# Patient Record
Sex: Female | Born: 1965 | Race: White | Hispanic: No | Marital: Married | Smoking: Never smoker
Health system: Southern US, Community
[De-identification: ages and names within clinical notes are randomized; demographics above are authoritative.]

## PROBLEM LIST (undated history)

## (undated) DIAGNOSIS — M069 Rheumatoid arthritis, unspecified: Secondary | ICD-10-CM

## (undated) DIAGNOSIS — S329XXA Fracture of unspecified parts of lumbosacral spine and pelvis, initial encounter for closed fracture: Secondary | ICD-10-CM

## (undated) DIAGNOSIS — F419 Anxiety disorder, unspecified: Secondary | ICD-10-CM

## (undated) DIAGNOSIS — S82409A Unspecified fracture of shaft of unspecified fibula, initial encounter for closed fracture: Secondary | ICD-10-CM

## (undated) DIAGNOSIS — F329 Major depressive disorder, single episode, unspecified: Secondary | ICD-10-CM

## (undated) DIAGNOSIS — J302 Other seasonal allergic rhinitis: Secondary | ICD-10-CM

## (undated) DIAGNOSIS — B029 Zoster without complications: Secondary | ICD-10-CM

## (undated) HISTORY — DX: Rheumatoid arthritis, unspecified: M06.9

## (undated) HISTORY — DX: Major depressive disorder, single episode, unspecified: F32.9

## (undated) HISTORY — DX: Unspecified fracture of shaft of unspecified fibula, initial encounter for closed fracture: S82.409A

## (undated) HISTORY — DX: Fracture of unspecified parts of lumbosacral spine and pelvis, initial encounter for closed fracture: S32.9XXA

## (undated) HISTORY — DX: Zoster without complications: B02.9

## (undated) HISTORY — DX: Anxiety disorder, unspecified: F41.9

---

## 1997-10-17 DIAGNOSIS — F419 Anxiety disorder, unspecified: Secondary | ICD-10-CM

## 1997-10-17 DIAGNOSIS — F32A Depression, unspecified: Secondary | ICD-10-CM

## 1997-10-17 HISTORY — DX: Depression, unspecified: F32.A

## 1997-10-17 HISTORY — DX: Anxiety disorder, unspecified: F41.9

## 2005-10-17 HISTORY — PX: WISDOM TOOTH EXTRACTION: SHX21

## 2006-05-25 ENCOUNTER — Other Ambulatory Visit: Admission: RE | Admit: 2006-05-25 | Discharge: 2006-05-25 | Payer: Self-pay | Admitting: Obstetrics and Gynecology

## 2009-04-16 ENCOUNTER — Other Ambulatory Visit: Admission: RE | Admit: 2009-04-16 | Discharge: 2009-04-16 | Payer: Self-pay | Admitting: Family Medicine

## 2010-05-27 ENCOUNTER — Other Ambulatory Visit: Admission: RE | Admit: 2010-05-27 | Discharge: 2010-05-27 | Payer: Self-pay | Admitting: Family Medicine

## 2011-10-18 DIAGNOSIS — M069 Rheumatoid arthritis, unspecified: Secondary | ICD-10-CM

## 2011-10-18 HISTORY — DX: Rheumatoid arthritis, unspecified: M06.9

## 2013-05-30 ENCOUNTER — Encounter: Payer: Self-pay | Admitting: Nurse Practitioner

## 2013-05-30 ENCOUNTER — Ambulatory Visit (INDEPENDENT_AMBULATORY_CARE_PROVIDER_SITE_OTHER): Payer: BC Managed Care – PPO | Admitting: Nurse Practitioner

## 2013-05-30 VITALS — BP 124/76 | HR 64 | Resp 14 | Ht 71.0 in | Wt 242.2 lb

## 2013-05-30 DIAGNOSIS — Z0000 Encounter for general adult medical examination without abnormal findings: Secondary | ICD-10-CM

## 2013-05-30 DIAGNOSIS — Z01419 Encounter for gynecological examination (general) (routine) without abnormal findings: Secondary | ICD-10-CM

## 2013-05-30 DIAGNOSIS — N920 Excessive and frequent menstruation with regular cycle: Secondary | ICD-10-CM

## 2013-05-30 DIAGNOSIS — E559 Vitamin D deficiency, unspecified: Secondary | ICD-10-CM

## 2013-05-30 LAB — CBC
HCT: 31.7 % — ABNORMAL LOW (ref 36.0–46.0)
Hemoglobin: 10.7 g/dL — ABNORMAL LOW (ref 12.0–15.0)
MCHC: 33.8 g/dL (ref 30.0–36.0)
MCV: 88.5 fL (ref 78.0–100.0)
RDW: 16.3 % — ABNORMAL HIGH (ref 11.5–15.5)

## 2013-05-30 MED ORDER — MEDROXYPROGESTERONE ACETATE 10 MG PO TABS: 10.0000 mg | ORAL_TABLET | Freq: Every day | ORAL | Status: DC

## 2013-05-30 NOTE — Progress Notes (Signed)
Encounter reviewed by Dr. Laurna Shetley Silva.  

## 2013-05-30 NOTE — Patient Instructions (Signed)

## 2013-05-30 NOTE — Progress Notes (Signed)
47 y.o. G0. Partnered Caucasian Fe here for annual exam.  Menses irregular with menorrhagia in March. She skipped June and then started July 24 th with bleeding ever since.  Most days heavy. Super pad and tampon changing every hour.  No cramps. Bloating, or PMS.  Same partner X 7 years age 24 with normal cycles.  Patient's last menstrual period was 05/09/2013.          Sexually active: yes  The current method of family planning is none.    Exercising: no  The patient does not participate in regular exercise at present. Smoker:  no  Health Maintenance: Pap:  01/2012 normal with negative HR HPV at PCP per patient MMG:  05/2012  Colonoscopy:  never [facility]:   [facility] was low normal TDaP:  2007  Labs: PCP maintains all labs.    reports that she has never smoked. She has never used smokeless tobacco. She reports that she does not drink alcohol or use illicit drugs.  Past Medical History  Diagnosis Date  . Depression   . Anxiety     situational   . RA (rheumatoid arthritis) 2013    Past Surgical History  Procedure Laterality Date  . Wisdom tooth extraction  2007    Current Outpatient Prescriptions  Medication Sig Dispense Refill  . busPIRone (BUSPAR) 15 MG tablet Take 15 mg by mouth.      . cholecalciferol (VITAMIN D) 1000 UNITS tablet Take 1,000 Units by mouth daily.      Marland Kitchen etanercept (ENBREL) 50 MG/ML injection Inject 50 mg into the skin once a week.      . folic acid (FOLVITE) 1 MG tablet Take 1 mg by mouth.      . lamoTRIgine (LAMICTAL) 100 MG tablet Take 100 mg by mouth.      . methotrexate (RHEUMATREX) 2.5 MG tablet Take 2.5 mg by mouth.      . Multiple Vitamin (MULTIVITAMIN) tablet Take 1 tablet by mouth daily.      . Turmeric 450 MG CAPS Take by mouth.       No current facility-administered medications for this visit.    Family History  Problem Relation Age of Onset  . Thyroid disease Mother     hypothyroid   . Heart failure Father   . Dementia  Father   . Stroke Father   . Hypertension Father   . Psoriasis Father   . Lupus Maternal Grandmother     ROS:  Pertinent items are noted in HPI.  Otherwise, a comprehensive ROS was negative.  Exam:   BP 124/76  Pulse 64  Resp 14  Ht 5\' 11"  (1.803 m)  Wt 242 lb 3.2 oz (109.861 kg)  BMI 33.79 kg/m2  LMP 05/09/2013 Height: 5\' 11"  (180.3 cm)  Ht Readings from Last 3 Encounters:  05/30/13 5\' 11"  (1.803 m)    General appearance: alert, cooperative and appears stated age Head: Normocephalic, without obvious abnormality, atraumatic Neck: no adenopathy, supple, symmetrical, trachea midline and thyroid normal to inspection and palpation Lungs: clear to auscultation bilaterally Breasts: normal appearance, no masses or tenderness Heart: regular rate and rhythm Abdomen: soft, non-tender; no masses,  no organomegaly Extremities: extremities normal, atraumatic, no cyanosis or edema Skin: Skin color, texture, turgor normal. No rashes or lesions Lymph nodes: Cervical, supraclavicular, and axillary nodes normal. No abnormal inguinal nodes palpated Neurologic: Grossly normal   Pelvic: External genitalia:  no lesions  Urethra:  normal appearing urethra with no masses, tenderness or lesions              Bartholin's and Skene's: normal                 Vagina: normal appearing vagina with normal color and copious amount of thin bloody vaginal bleeding. No lesions              Cervix: anteverted              Pap taken: no Bimanual Exam:  Uterus: upper normal size, contour, position, consistency, mobility, non-tender              Adnexa: no mass, fullness, tenderness               Rectovaginal: Confirms               Anus:  normal sphincter tone, no lesions  A:  Well Woman with normal exam  Recent history of irregular menses  History of menorrhagia and current cycle X 3 weeks.  Female partner  History of RA and new start of Enbrel,  Vit D deficiency  History of anxiety and  depression - well controlled  P:   Pap smear as per guidelines   Mammogram will be scheduled later this month  Will plan Endo biopsy and PUS/ SHGM to evaluate AUB  Have put her on Provera 10 mg daily X 10 to hopefully slow or stop current bleeding - given potential side effects and anticipate a withdrawal bleed. (UPT - Neg)  Will also check labs - TSH and Vit D  Counseled on breast self exam, adequate intake of calcium and vitamin D, diet and exercise return annually or prn  An After Visit Summary was printed and given to the patient.

## 2013-05-31 ENCOUNTER — Telehealth: Payer: Self-pay | Admitting: *Deleted

## 2013-05-31 LAB — VITAMIN D 25 HYDROXY (VIT D DEFICIENCY, FRACTURES): Vit D, 25-Hydroxy: 36 ng/mL (ref 30–89)

## 2013-05-31 NOTE — Telephone Encounter (Signed)
Message copied by Osie Bond on Fri May 31, 2013  3:43 PM ------      Message from: Ria Comment R      Created: Fri May 31, 2013  1:06 PM       As suspected iron count is low with a Hgb of 10.7 - have her to stay on iron tablet.  Vit D is a little low add OTC Vit D a 1000 IU daily. TSH is normal. ------

## 2013-05-31 NOTE — Telephone Encounter (Signed)
LVM to return my call in regards to lab results.

## 2013-06-03 ENCOUNTER — Telehealth: Payer: Self-pay | Admitting: Nurse Practitioner

## 2013-06-03 NOTE — Telephone Encounter (Signed)
Pt is aware of all lab results and is agreeable to vitamin D 1000 IU 1 po qd (otc) and iron (otc).

## 2013-06-03 NOTE — Telephone Encounter (Signed)
Patient is returning a call from [place], please call work number.

## 2013-06-03 NOTE — Telephone Encounter (Signed)
Message copied by Osie Bond on Mon Jun 03, 2013  4:36 PM ------      Message from: Ria Comment R      Created: Fri May 31, 2013  1:06 PM       As suspected iron count is low with a Hgb of 10.7 - have her to stay on iron tablet.  Vit D is a little low add OTC Vit D a 1000 IU daily. TSH is normal. ------

## 2013-06-03 NOTE — Telephone Encounter (Signed)
2nd voice message left for pt to return my call in regards to lab results.

## 2013-06-03 NOTE — Telephone Encounter (Signed)
Message copied by Osie Bond on Mon Jun 03, 2013  4:33 PM ------      Message from: Ria Comment R      Created: Fri May 31, 2013  1:06 PM       As suspected iron count is low with a Hgb of 10.7 - have her to stay on iron tablet.  Vit D is a little low add OTC Vit D a 1000 IU daily. TSH is normal. ------

## 2013-06-03 NOTE — Telephone Encounter (Signed)
LMTCB to discuss ins benefits and schedule shgm/endo bx

## 2013-06-06 NOTE — Telephone Encounter (Signed)
LMTCB to discuss ins benefits and schedule SHGM/Endo Bx.  °

## 2013-06-14 NOTE — Telephone Encounter (Signed)
LMTCB to discuss ins benefits and schedule SHGM/poss Endo Bx

## 2013-06-18 ENCOUNTER — Other Ambulatory Visit: Payer: Self-pay | Admitting: Obstetrics and Gynecology

## 2013-06-18 ENCOUNTER — Telehealth: Payer: Self-pay | Admitting: *Deleted

## 2013-06-18 DIAGNOSIS — N920 Excessive and frequent menstruation with regular cycle: Secondary | ICD-10-CM

## 2013-06-18 NOTE — Telephone Encounter (Signed)
Patient calling to schedule [facility] appt.  On menses now, denies chance for pregnancy due to female/female relationship.  PUS/SHGM/endo bx sched for 06-27-13.  Instructed to take Tylenol before arrival ( patient states she can not take Motrin).

## 2013-06-27 ENCOUNTER — Ambulatory Visit (INDEPENDENT_AMBULATORY_CARE_PROVIDER_SITE_OTHER): Payer: BC Managed Care – PPO | Admitting: Obstetrics and Gynecology

## 2013-06-27 ENCOUNTER — Encounter: Payer: Self-pay | Admitting: Obstetrics and Gynecology

## 2013-06-27 ENCOUNTER — Other Ambulatory Visit: Payer: Self-pay | Admitting: Obstetrics and Gynecology

## 2013-06-27 ENCOUNTER — Other Ambulatory Visit: Payer: Self-pay

## 2013-06-27 ENCOUNTER — Ambulatory Visit (INDEPENDENT_AMBULATORY_CARE_PROVIDER_SITE_OTHER): Payer: BC Managed Care – PPO

## 2013-06-27 VITALS — BP 110/70 | HR 68 | Ht 71.0 in | Wt 245.5 lb

## 2013-06-27 DIAGNOSIS — N920 Excessive and frequent menstruation with regular cycle: Secondary | ICD-10-CM

## 2013-06-27 DIAGNOSIS — N921 Excessive and frequent menstruation with irregular cycle: Secondary | ICD-10-CM

## 2013-06-27 DIAGNOSIS — R9389 Abnormal findings on diagnostic imaging of other specified body structures: Secondary | ICD-10-CM

## 2013-06-27 NOTE — Progress Notes (Signed)
Subjective  Patient is here for pelvic ultrasound, sonohysterogram, and endometrial biopsy. Bleeding improved with Provera therapy.  Objective  See ultrasound below.  Echogenic foci of the endocervix and endometrium.  Normal ovaries.      Sonohysterogram   Consent performed.  Speculum placed.  Sterile prep of cervix with betadine.  Cannula placed.  Speculum removed.  Saline injected. Filling defects noted throughout the endometrial cavity and in the endocervix.  No complications.  Endometrial biopsy  Consent performed.  Speculum placed.  Pipelle passed twice.  Tissue to pathology.  No complications.  Minimal EBL.  Assessment  Menometrorrhagia. Endometrial thickening. Suspect endocervical and endometrial polyps.  Plan  Follow up in 7 - 19 days for discussion of results and recommendations.  After visit summary to patient.

## 2013-06-27 NOTE — Patient Instructions (Signed)
Endometrial Biopsy This is a test in which a tissue sample (a biopsy) is taken from inside the uterus (womb). It is then looked at by a specialist under a microscope to see if the tissue is normal or abnormal. The endometrium is the lining of the uterus. This test helps determine where you are in your menstrual cycle and how hormone levels are affecting the lining of the uterus. Another use for this test is to diagnose endometrial cancer, tuberculosis, polyps, or inflammatory conditions and to evaluate uterine bleeding. PREPARATION FOR TEST No preparation or fasting is necessary. NORMAL FINDINGS No pathologic conditions. Presence of "secretory-type" endometrium 3 to 5 days before to normal menstruation. Ranges for normal findings may vary among different laboratories and hospitals. You should always check with your doctor after having lab work or other tests done to discuss the meaning of your test results and whether your values are considered within normal limits. MEANING OF TEST  Your caregiver will go over the test results with you and discuss the importance and meaning of your results, as well as treatment options and the need for additional tests if necessary. OBTAINING THE TEST RESULTS It is your responsibility to obtain your test results. Ask the lab or department performing the test when and how you will get your results. Document Released: 02/03/2005 Document Revised: 12/26/2011 Document Reviewed: 09/12/2008 Exit[employer] Patient Information 2014 [employer].  

## 2013-06-28 ENCOUNTER — Encounter: Payer: Self-pay | Admitting: Obstetrics and Gynecology

## 2013-06-28 DIAGNOSIS — N921 Excessive and frequent menstruation with irregular cycle: Secondary | ICD-10-CM | POA: Insufficient documentation

## 2013-06-28 DIAGNOSIS — R9389 Abnormal findings on diagnostic imaging of other specified body structures: Secondary | ICD-10-CM | POA: Insufficient documentation

## 2013-06-28 DIAGNOSIS — N920 Excessive and frequent menstruation with regular cycle: Secondary | ICD-10-CM | POA: Insufficient documentation

## 2013-07-05 ENCOUNTER — Ambulatory Visit (INDEPENDENT_AMBULATORY_CARE_PROVIDER_SITE_OTHER): Payer: BC Managed Care – PPO | Admitting: Obstetrics and Gynecology

## 2013-07-05 ENCOUNTER — Telehealth: Payer: Self-pay | Admitting: Obstetrics and Gynecology

## 2013-07-05 ENCOUNTER — Encounter: Payer: Self-pay | Admitting: Obstetrics and Gynecology

## 2013-07-05 VITALS — BP 110/60 | HR 100 | Ht 71.0 in | Wt 246.0 lb

## 2013-07-05 DIAGNOSIS — N83202 Unspecified ovarian cyst, left side: Secondary | ICD-10-CM

## 2013-07-05 DIAGNOSIS — N83209 Unspecified ovarian cyst, unspecified side: Secondary | ICD-10-CM

## 2013-07-05 DIAGNOSIS — Z1239 Encounter for other screening for malignant neoplasm of breast: Secondary | ICD-10-CM

## 2013-07-05 NOTE — Progress Notes (Signed)
Patient ID: Jenny Bailey, female   DOB: 1966/03/27, 47 y.o.   MRN: [id number]  Subjective  Patient here in follow up for pelvic ultrasound and endometrial biopsy.   Ultrasound on 06/27/13 showed ultrasound below. Echogenic foci of the endocervix and endometrium. Simple left ovarian cyst 3.3 cm.  July menses lasted for weeks.  Provera stopped bleeding, then menses returned to 7 days.    Patient interested in ablation.   Declines future childbearing. Has female partner.   Due for mammogram.  Has not scheduled yet.   Objective  No exam.    EMB FINAL MICROSCOPIC DIAGNOSIS: ENDOMETRIUM, BIOPSY: -PROLIFERATIVE ENDOMETRIUM AND PORTIONS OF POLYPOID ENDOMETRIUM WITH PROMINENT BLOOD VESSELS, SUGGESTIVE OF BENIGN ENDOMETRIAL POLYP -NEGATIVE FOR HYPERPLASIA OR CARCINOMA  Assessment  Menometrorrhagia. Endometrial and endocervical polyps. Simple left ovarian cyst.   Plan  Proceed with hysteroscopic polypectomy, dilation and curettage, and Novasure endometrial ablation.  Risks, benefits, and alternatives discussed with the patient who wishes to proceed.   Risks include but are not limited to bleeding, infection, damage to surrounding organs, uterine perforation and subsequent laparoscopy, DVT, PE, reaction to anesthesia, death, incomplete procedure if perforation occurs.   [facility]  Mammogram ordered.

## 2013-07-05 NOTE — Patient Instructions (Addendum)
Endometrial Ablation Endometrial ablation removes the lining of the uterus (endometrium). It is usually a same day, outpatient treatment. Ablation helps avoid major surgery (such as a hysterectomy). A hysterectomy is removal of the cervix and uterus. Endometrial ablation has less risk and complications, has a shorter recovery period and is less expensive. After endometrial ablation, most women will have little or no menstrual bleeding. You may not keep your fertility. Pregnancy is no longer likely after this procedure but if you are pre-menopausal, you still need to use a reliable method of birth control following the procedure because pregnancy can occur. REASONS TO HAVE THE PROCEDURE MAY INCLUDE:  Heavy periods.  Bleeding that is causing anemia.  Anovulatory bleeding, very irregular, bleeding.  Bleeding submucous fibroids (on the lining inside the uterus) if they are smaller than 3 centimeters. REASONS NOT TO HAVE THE PROCEDURE MAY INCLUDE:  You wish to have more children.  You have a pre-cancerous or cancerous problem. The cause of any abnormal bleeding must be diagnosed before having the procedure.  You have pain coming from the uterus.  You have a submucus fibroid larger than 3 centimeters.  You recently had a baby.  You recently had an infection in the uterus.  You have a severe retro-flexed, tipped uterus and cannot insert the instrument to do the ablation.  You had a Cesarean section or deep major surgery on the uterus.  The inner cavity of the uterus is too large for the endometrial ablation instrument. RISKS AND COMPLICATIONS   Perforation of the uterus.  Bleeding.  Infection of the uterus, bladder or vagina.  Injury to surrounding organs.  Cutting the cervix.  An air bubble to the lung (air embolus).  Pregnancy following the procedure.  Failure of the procedure to help the problem requiring hysterectomy.  Decreased ability to diagnose cancer in the lining of  the uterus. BEFORE THE PROCEDURE  The lining of the uterus must be tested to make sure there is no pre-cancerous or cancer cells present.  Medications may be given to make the lining of the uterus thinner.  Ultrasound may be used to evaluate the size and look for abnormalities of the uterus.  Future pregnancy is not desired. PROCEDURE  There are different ways to destroy the lining of the uterus.   Resectoscope - radio frequency-alternating electric current is the most common one used.  Cryotherapy - freezing the lining of the uterus.  Heated Free Liquid - heated salt (saline) solution inserted into the uterus.  Microwave - uses high energy microwaves in the uterus.  Thermal Balloon - a catheter with a balloon tip is inserted into the uterus and filled with heated fluid. Your caregiver will talk with you about the method used in this clinic. They will also instruct you on the pros and cons of the procedure. Endometrial ablation is performed along with a procedure called operative hysteroscopy. A narrow viewing tube is inserted through the birth canal (vagina) and through the cervix into the uterus. A tiny camera attached to the viewing tube (hysteroscope) allows the uterine cavity to be shown on a TV monitor during surgery. Your uterus is filled with a harmless liquid to make the procedure easier. The lining of the uterus is then removed. The lining can also be removed with a resectoscope which allows your surgeon to cut away the lining of the uterus under direct vision. Usually, you will be able to go home within an hour after the procedure. HOME CARE INSTRUCTIONS   Do   not drive for 24 hours.  No tampons, douching or intercourse for 2 weeks or until your caregiver approves.  Rest at home for 24 to 48 hours. You may then resume normal activities unless told differently by your caregiver.  Take your temperature two times a day for 4 days, and record it.  Take any medications your  caregiver has ordered, as directed.  Use some form of contraception if you are pre-menopausal and do not want to get pregnant. Bleeding after the procedure is normal. It varies from light spotting and mildly watery to bloody discharge for 4 to 6 weeks. You may also have mild cramping. Only take over-the-counter or prescription medicines for pain, discomfort, or fever as directed by your caregiver. Do not use aspirin, as this may aggravate bleeding. Frequent urination during the first 24 hours is normal. You will not know how effective your surgery is until at least 3 months after the surgery. SEEK IMMEDIATE MEDICAL CARE IF:   Bleeding is heavier than a normal menstrual cycle.  An oral temperature above 102 F (38.9 C) develops.  You have increasing cramps or pains not relieved with medication or develop belly (abdominal) pain which does not seem to be related to the same area of earlier cramping and pain.  You are light headed, weak or have fainting episodes.  You develop pain in the shoulder strap areas.  You have chest or leg pain.  You have abnormal vaginal discharge.  You have painful urination. Document Released: 08/12/2004 Document Revised: 12/26/2011 Document Reviewed: 11/10/2007 Exit[employer] Patient Information 2014 [employer].  

## 2013-07-05 NOTE — Telephone Encounter (Signed)
LMTCB to discuss ins benefits for surgery. °

## 2013-07-08 NOTE — Telephone Encounter (Signed)
Spoke with patient about her ins benefits for surgery. She wants to think about it and will call back when she is ready to proceed with scheduling.

## 2013-07-08 NOTE — Telephone Encounter (Signed)
Pt returning call

## 2013-08-07 ENCOUNTER — Telehealth: Payer: Self-pay | Admitting: *Deleted

## 2013-08-07 NOTE — Telephone Encounter (Signed)
Call to patient, LMTCB

## 2013-08-07 NOTE — Telephone Encounter (Signed)
Message copied by Alisa Graff on Wed Aug 07, 2013 11:02 AM ------      Message from: Conley Simmonds      Created: Wed Aug 07, 2013 10:25 AM      Regarding: follow up care       Kennon Rounds,            Please reach out to this patient who has multiple endometrial polyps and a possible endocervical polyp diagnosed by ultrasound, sonohysterogram, and endometrial biopsy.            Patient has stated to Carolynn that she wants to wait until 2015 to do a hysteroscopy, polypectomy, dilation and curettage, and ablation.              If she will not be proceeding this year, I will need to see her just after the new year to re-evaluate.  She will also need to be placed in a "surgical hold file."            Thanks!            Conley Simmonds ------

## 2013-08-22 ENCOUNTER — Other Ambulatory Visit: Payer: Self-pay

## 2013-09-27 ENCOUNTER — Telehealth: Payer: Self-pay | Admitting: *Deleted

## 2013-09-27 NOTE — Telephone Encounter (Signed)
See next phone note.

## 2013-09-27 NOTE — Telephone Encounter (Signed)
Call to patient, VM has first and last name confirmation, LM calling to schedule follow up/reevaluation since declined hysteroscopy in Oct.  LMTCB.

## 2013-11-04 NOTE — Telephone Encounter (Signed)
Gabriel Cirri,  Please assist me in calling to schedule.

## 2013-11-11 NOTE — Telephone Encounter (Signed)
Voicemail confirmed patient identity/[email or web address]

## 2014-01-13 ENCOUNTER — Telehealth: Payer: Self-pay | Admitting: Nurse Practitioner

## 2014-01-13 NOTE — Telephone Encounter (Signed)
Patient would an appointment to discuss surgery. Patient says she talked with Patty about this at her last visit.

## 2014-01-13 NOTE — Telephone Encounter (Signed)
Spoke with patient. Patient was last seen on 9/19 by Dr.Silva. States that having an ablation was discussed but she is having some concerns about this due to fluctuations with RA. Would like to come in to discuss a hysterectomy with Dr.Silva instead. Appointment scheduled for 01/15/14 at 1530 with Dr.Silva. Patient agreeable to time and date.  Routing to provider for final review. Patient agreeable to disposition. Will close encounter

## 2014-01-15 ENCOUNTER — Encounter: Payer: Self-pay | Admitting: Obstetrics and Gynecology

## 2014-01-15 ENCOUNTER — Ambulatory Visit (INDEPENDENT_AMBULATORY_CARE_PROVIDER_SITE_OTHER): Payer: BC Managed Care – PPO | Admitting: Obstetrics and Gynecology

## 2014-01-15 VITALS — BP 110/68 | HR 84 | Ht 71.0 in | Wt 245.2 lb

## 2014-01-15 DIAGNOSIS — N920 Excessive and frequent menstruation with regular cycle: Secondary | ICD-10-CM

## 2014-01-15 LAB — CBC
HCT: 34.7 % — ABNORMAL LOW (ref 36.0–46.0)
Hemoglobin: 11.7 g/dL — ABNORMAL LOW (ref 12.0–15.0)
MCH: 30.5 pg (ref 26.0–34.0)
MCHC: 33.7 g/dL (ref 30.0–36.0)
MCV: 90.4 fL (ref 78.0–100.0)
PLATELETS: 249 10*3/uL (ref 150–400)
RBC: 3.84 MIL/uL — AB (ref 3.87–5.11)
RDW: 15.1 % (ref 11.5–15.5)
WBC: 4.4 10*3/uL (ref 4.0–10.5)

## 2014-01-15 NOTE — Progress Notes (Signed)
Patient ID: Jenny Bailey, female   DOB: 04-Feb-1966, 48 y.o.   MRN: [id number] GYNECOLOGY VISIT  PCP:   Jonathon Jordan, MD  Referring provider:   HPI: 48 y.o.   Unknown  Caucasian  female   G0P0 with Patient's last menstrual period was 01/01/2014.   here for  Heavy menses and to discuss hysterectomy. Patient's partner is also present.   Menses every 6 weeks. Last 10 days. Heavy flow and using pad plus tampon every 30 minutes. Lightheaded.  No real pain.   When has menses has increased symptoms of rheumatoid arthritis.  Flares really affect the quality of patient's life and really do coincide.   Pelvic ultrasound 06/27/13 - normal uterus with no masses.  Endometrium thickened and with echogenic focus 10 mm.  Ovaries normal.  No free fluid.  Sonohysterogram 06/27/13 showed multiple filling defects 1 mm and less of the endometrium and of the endocervix 8 mm and less. Endometrial biopsy 06/27/13 showing proliferative endometrium and polypoid endometrium with blood vessesl suggestive of benign endometrial polyp, negative for hyperplasia and cancer.   GYNECOLOGIC HISTORY: Patient's last menstrual period was 01/01/2014. Sexually active:  yes Partner preference: female Contraception:  none  Menopausal hormone therapy: n/a DES exposure:   no Blood transfusions: no    Sexually transmitted diseases:  no  GYN procedures and prior surgeries:  none Last mammogram: 05/2012 [facility]                Last pap and high risk HPV testing: 01/2012 wnl:neg HR HPV(with PCP)   History of abnormal pap smear: no    OB History   Grav Para Term Preterm Abortions TAB SAB Ect Mult Living   0                LIFESTYLE: Exercise:  no            Tobacco:  no Alcohol:  no Drug use:  no  Patient Active Problem List   Diagnosis Date Noted  . Left ovarian cyst 07/05/2013  . Menorrhagia 06/28/2013  . Metrorrhagia 06/28/2013  . Endometrial thickening on ultra sound 06/28/2013    Past Medical  History  Diagnosis Date  . Depression   . Anxiety     situational   . RA (rheumatoid arthritis) 2013    Past Surgical History  Procedure Laterality Date  . Wisdom tooth extraction  2007    Current Outpatient Prescriptions  Medication Sig Dispense Refill  . busPIRone (BUSPAR) 15 MG tablet Take 15 mg by mouth.      . cholecalciferol (VITAMIN D) 1000 UNITS tablet Take 1,000 Units by mouth daily.      Marland Kitchen etanercept (ENBREL) 50 MG/ML injection Inject 50 mg into the skin once a week.      . fluticasone (FLONASE) 50 MCG/ACT nasal spray Place 1 spray into both nostrils daily.      . folic acid (FOLVITE) 1 MG tablet Take 1 mg by mouth.      . hydroxychloroquine (PLAQUENIL) 200 MG tablet Take 200 mg by mouth daily.      Marland Kitchen lamoTRIgine (LAMICTAL) 100 MG tablet Take 100 mg by mouth.      Marland Kitchen LORazepam (ATIVAN) 0.5 MG tablet Take 0.5 mg by mouth as needed.      . methotrexate (RHEUMATREX) 2.5 MG tablet Take 2.5 mg by mouth.      . Multiple Vitamin (MULTIVITAMIN) tablet Take 1 tablet by mouth daily.      . sertraline (ZOLOFT)  50 MG tablet Take 50 mg by mouth daily. 3 tabs daily      . Turmeric 450 MG CAPS Take by mouth.       No current facility-administered medications for this visit.     ALLERGIES: Review of patient's allergies indicates no known allergies.  Family History  Problem Relation Age of Onset  . Thyroid disease Mother     hypothyroid   . Heart failure Father   . Dementia Father   . Stroke Father   . Hypertension Father   . Psoriasis Father   . Lupus Maternal Grandmother   . Breast cancer Maternal Grandmother 60    History   Social History  . Marital Status: Unknown    Spouse Name: N/A    Number of Children: N/A  . Years of Education: N/A   Occupational History  . Not on file.   Social History Main Topics  . Smoking status: Never Smoker   . Smokeless tobacco: Never Used  . Alcohol Use: No  . Drug Use: No  . Sexual Activity: Yes    Partners: Female    Publishing copy Protection: None   Other Topics Concern  . Not on file   Social History Narrative  . No narrative on file    ROS:  Pertinent items are noted in HPI.  PHYSICAL EXAMINATION:    BP 110/68  Pulse 84  Ht 5\' 11"  (1.803 m)  Wt 245 lb 3.2 oz (111.222 kg)  BMI 34.21 kg/m2  LMP 01/01/2014   Wt Readings from Last 3 Encounters:  01/15/14 245 lb 3.2 oz (111.222 kg)  07/05/13 246 lb (111.585 kg)  06/27/13 245 lb 8 oz (111.358 kg)     Ht Readings from Last 3 Encounters:  01/15/14 5\' 11"  (1.803 m)  07/05/13 5\' 11"  (1.803 m)  06/27/13 5\' 11"  (1.803 m)    General appearance: alert, cooperative and appears stated age Abdomen: soft, non-tender; no masses,  no organomegaly   Pelvic: External genitalia:  no lesions              Urethra:  normal appearing urethra with no masses, tenderness or lesions              Bartholins and Skenes: normal                 Vagina: normal appearing vagina with normal color and discharge, no lesions              Cervix: normal appearance                 Bimanual Exam:  Uterus:  uterus is normal size, shape, consistency and nontender - exam limited by involuntary guarding.                                      Adnexa: normal adnexa in size, nontender and no masses                                         ASSESSMENT  Menorrhagia. Endometrial polyps suspected.  PLAN  Discussion of hysterectomy I have had a comprehensive discussion with the patient regarding.  I have provided material regarding robotic hysterectomy    We discussed benefits and risks of surgery which include but are not limited to bleeding,  infection, damage to surrounding organs, ureteral damage, vaginal cuff dehiscence,  DVT, PE, death, and reaction to anesthesia.    I have discussed surgical expectations regarding the procedures and success rates, outcomes, and recovery.     An After Visit Summary was printed and given to the patient.   25 minutes face to face time of which  over 50% in patient counseling.

## 2014-01-15 NOTE — Patient Instructions (Signed)
Hysterectomy Information  A hysterectomy is a surgery in which your uterus is removed. This surgery may be done to treat various medical problems. After the surgery, you will no longer have menstrual periods. The surgery will also make you unable to become pregnant (sterile). The fallopian tubes and ovaries can be removed (bilateral salpingo-oophorectomy) during this surgery as well.  REASONS FOR A HYSTERECTOMY  Persistent, abnormal bleeding.  Lasting (chronic) pelvic pain or infection.  The lining of the uterus (endometrium) starts growing outside the uterus (endometriosis).  The endometrium starts growing in the muscle of the uterus (adenomyosis).  The uterus falls down into the vagina (pelvic organ prolapse).  Noncancerous growths in the uterus (uterine fibroids) that cause symptoms.  Precancerous cells.  Cervical cancer or uterine cancer. TYPES OF HYSTERECTOMIES  Supracervical hysterectomy In this type, the top part of the uterus is removed, but not the cervix.  Total hysterectomy The uterus and cervix are removed.  Radical hysterectomy The uterus, the cervix, and the fibrous tissue that holds the uterus in place in the pelvis (parametrium) are removed. WAYS A HYSTERECTOMY CAN BE PERFORMED  Abdominal hysterectomy A large surgical cut (incision) is made in the abdomen. The uterus is removed through this incision.  Vaginal hysterectomy An incision is made in the vagina. The uterus is removed through this incision. There are no abdominal incisions.  Conventional laparoscopic hysterectomy Three or four small incisions are made in the abdomen. A thin, lighted tube with a camera (laparoscope) is inserted into one of the incisions. Other tools are put through the other incisions. The uterus is cut into small pieces. The small pieces are removed through the incisions, or they are removed through the vagina.  Laparoscopically assisted vaginal hysterectomy (LAVH) Three or four small  incisions are made in the abdomen. Part of the surgery is performed laparoscopically and part vaginally. The uterus is removed through the vagina.  Robot-assisted laparoscopic hysterectomy A laparoscope and other tools are inserted into 3 or 4 small incisions in the abdomen. A computer-controlled device is used to give the surgeon a 3D image and to help control the surgical instruments. This allows for more precise movements of surgical instruments. The uterus is cut into small pieces and removed through the incisions or removed through the vagina. RISKS AND COMPLICATIONS  Possible complications associated with this procedure include:  Bleeding and risk of blood transfusion. Tell your health care provider if you do not want to receive any blood products.  Blood clots in the legs or lung.  Infection.  Injury to surrounding organs.  Problems or side effects related to anesthesia.  Conversion to an abdominal hysterectomy from one of the other techniques. WHAT TO EXPECT AFTER A HYSTERECTOMY  You will be given pain medicine.  You will need to have someone with you for the first 3 5 days after you go home.  You will need to follow up with your surgeon in 2 4 weeks after surgery to evaluate your progress.  You may have early menopause symptoms such as hot flashes, night sweats, and insomnia.  If you had a hysterectomy for a problem that was not cancer or not a condition that could lead to cancer, then you no longer need Pap tests. However, even if you no longer need a Pap test, a regular exam is a good idea to make sure no other problems are starting. Document Released: 03/29/2001 Document Revised: 07/24/2013 Document Reviewed: 06/10/2013 [facility] Patient Information 2014 [place].

## 2014-01-16 LAB — TSH: TSH: 1.175 u[IU]/mL (ref 0.350–4.500)

## 2014-01-21 ENCOUNTER — Telehealth: Payer: Self-pay | Admitting: Obstetrics and Gynecology

## 2014-01-21 NOTE — Telephone Encounter (Signed)
Left message for patient to call back. Need to go over surgery benefits. °

## 2014-01-21 NOTE — Telephone Encounter (Signed)
Spoke with patient. Advised that per insurance quote received, she will be responsible for $1064.99 for the surgeons portion of her surgery. Advised that the nurse would contact her to schedule. Patient agreeable. Patient prefers to schedule early May or after June 15.

## 2014-01-21 NOTE — Telephone Encounter (Signed)
Pt returned call

## 2014-01-28 NOTE — Telephone Encounter (Signed)
Surgery scheduled for 02-25-14 based on dates given to Sabrina.  Call to patient. Notified of surgery date and time, patient agreeable.  Surgery instruction sheet reviewed and will mail to patient.  Pre/post op appointments scheduled.  Routing to provider for final review. Patient agreeable to disposition. Will close encounter

## 2014-01-28 NOTE — Telephone Encounter (Signed)
Patient is calling to check the status of surgery. °

## 2014-02-10 ENCOUNTER — Ambulatory Visit (INDEPENDENT_AMBULATORY_CARE_PROVIDER_SITE_OTHER): Payer: BC Managed Care – PPO | Admitting: Obstetrics and Gynecology

## 2014-02-10 ENCOUNTER — Encounter: Payer: Self-pay | Admitting: Obstetrics and Gynecology

## 2014-02-10 VITALS — BP 120/70 | HR 82 | Ht 71.25 in | Wt 244.2 lb

## 2014-02-10 DIAGNOSIS — N920 Excessive and frequent menstruation with regular cycle: Secondary | ICD-10-CM

## 2014-02-10 NOTE — Patient Instructions (Signed)
I will see you the day of surgery!  Do not take the Enbrel the week before your surgery and do not take the methotrexate the Sunday before your surgery.

## 2014-02-10 NOTE — Progress Notes (Signed)
Patient ID: Jenny Bailey, female DOB: 05/10/1966, 47 y.o. MRN: [id number]  GYNECOLOGY VISIT  PCP: Sharon Wolters, MD  Referring provider:  HPI:  47 y.o. Partnered Caucasian female  G0P0 with Patient's last menstrual period 02-10-14.  here to discuss surgery.  Menses every 6 weeks.  Last 10 days.  Heavy flow and using pad plus tampon every 30 minutes.  Lightheaded.  No real pain.  When has menses has increased symptoms of rheumatoid arthritis. Takes Enbrel once a week on Thursdays and Metrotrexate once a week on Sundays.  Wants hysterectomy and ovarian removal.  Declines future childbearing.  Flares really affect the quality of patient's life and really do coincide.  Pelvic ultrasound 06/27/13 - normal uterus with no masses. Endometrium thickened and with echogenic focus 10 mm. Ovaries normal. No free fluid.  Sonohysterogram 06/27/13 showed multiple filling defects 1 mm and less of the endometrium and of the endocervix 8 mm and less.  Endometrial biopsy 06/27/13 showing proliferative endometrium and polypoid endometrium with blood vessesl suggestive of benign endometrial polyp, negative for hyperplasia and cancer.  GYNECOLOGIC HISTORY:  Patient's last menstrual period was 01/01/2014.  Sexually active: yes  Partner preference: female  Contraception: none  Menopausal hormone therapy: n/a  DES exposure: no  Blood transfusions: no  Sexually transmitted diseases: no  GYN procedures and prior surgeries: none  Last mammogram: 05/2012 wnl:[facility]. Did last week at [facility]. Told it was normal by email.  Last pap and high risk HPV testing: 01/2012 wnl:neg HR HPV(with PCP)  History of abnormal pap smear: no  OB History    Grav  Para  Term  Preterm  Abortions  TAB  SAB  Ect  Mult  Living    0              LIFESTYLE:  Exercise: no  Tobacco: no  Alcohol: no  Drug use: no  Patient Active Problem List    Diagnosis  Date Noted   .  Left ovarian cyst  07/05/2013   .  Menorrhagia  06/28/2013    .  Metrorrhagia  06/28/2013   .  Endometrial thickening on ultra sound  06/28/2013    Past Medical History   Diagnosis  Date   .  Depression    .  Anxiety      situational   .  RA (rheumatoid arthritis)  2013    Past Surgical History   Procedure  Laterality  Date   .  Wisdom tooth extraction   2007    Current Outpatient Prescriptions   Medication  Sig  Dispense  Refill   .  busPIRone (BUSPAR) 15 MG tablet  Take 15 mg by mouth.     .  cholecalciferol (VITAMIN D) 1000 UNITS tablet  Take 1,000 Units by mouth daily.     .  etanercept (ENBREL) 50 MG/ML injection  Inject 50 mg into the skin once a week.     .  fluticasone (FLONASE) 50 MCG/ACT nasal spray  Place 1 spray into both nostrils daily.     .  folic acid (FOLVITE) 1 MG tablet  Take 1 mg by mouth.     .  hydroxychloroquine (PLAQUENIL) 200 MG tablet  Take 200 mg by mouth daily.     .  lamoTRIgine (LAMICTAL) 100 MG tablet  Take 100 mg by mouth.     .  LORazepam (ATIVAN) 0.5 MG tablet  Take 0.5 mg by mouth as needed.     .  methotrexate (  RHEUMATREX) 2.5 MG tablet  Take 2.5 mg by mouth.     .  Multiple Vitamin (MULTIVITAMIN) tablet  Take 1 tablet by mouth daily.     .  sertraline (ZOLOFT) 50 MG tablet  Take 50 mg by mouth daily. 3 tabs daily     .  Turmeric 450 MG CAPS  Take by mouth.      No current facility-administered medications for this visit.   ALLERGIES: Review of patient's allergies indicates no known allergies.  Family History   Problem  Relation  Age of Onset   .  Thyroid disease  Mother      hypothyroid   .  Heart failure  Father    .  Dementia  Father    .  Stroke  Father    .  Hypertension  Father    .  Psoriasis  Father    .  Lupus  Maternal Grandmother    .  Breast cancer  Maternal Grandmother  60    History    Social History   .  Marital Status:  Unknown     Spouse Name:  N/A     Number of Children:  N/A   .  Years of Education:  N/A    Occupational History   .  Not on file.    Social History  Main Topics   .  Smoking status:  Never Smoker   .  Smokeless tobacco:  Never Used   .  Alcohol Use:  No   .  Drug Use:  No   .  Sexual Activity:  Yes     Partners:  Female     Birth Control/ Protection:  None    Other Topics  Concern   .  Not on file    Social History Narrative   .  No narrative on file   ROS: Pertinent items are noted in HPI.  PHYSICAL EXAMINATION:  LMP 01/01/2014  Wt Readings from Last 3 Encounters:   01/15/14  245 lb 3.2 oz (111.222 kg)   07/05/13  246 lb (111.585 kg)   06/27/13  245 lb 8 oz (111.358 kg)    Ht Readings from Last 3 Encounters:   01/15/14  5' 11" (1.803 m)   07/05/13  5' 11" (1.803 m)   06/27/13  5' 11" (1.803 m)   General appearance: alert, cooperative and appears stated age  Head: Normocephalic, without obvious abnormality, atraumatic  Neck: no adenopathy, supple, symmetrical, trachea midline and thyroid not enlarged, symmetric, no tenderness/mass/nodules  Lungs: clear to auscultation bilaterally  Breasts: Inspection negative, No nipple retraction or dimpling, No nipple discharge or bleeding, No axillary or supraclavicular adenopathy, Normal to palpation without dominant masses  Heart: regular rate and rhythm  Abdomen: soft, non-tender; no masses, no organomegaly  Extremities: extremities normal, atraumatic, no cyanosis or edema  Skin: Skin color, texture, turgor normal. No rashes or lesions  Lymph nodes: Cervical, supraclavicular, and axillary nodes normal.  No abnormal inguinal nodes palpated  Neurologic: Grossly normal  Pelvic: External genitalia: no lesions  Urethra: normal appearing urethra with no masses, tenderness or lesions  Bartholins and Skenes: normal  Vagina: normal appearing vagina with normal color and discharge, no lesions  Cervix: normal appearance  Bimanual Exam: Uterus: uterus is normal size, shape, consistency and nontender  Adnexa: normal adnexa in size, nontender and no masses  ASSESSMENT  Menorrhagia.  History  of rheumatoid arthritis. On Enbrel and Methotrexate.  PLAN  Robotic total laparoscopic   hysterectomy with bilateral salpingo-oophorectomy.  Risks, benefits, and alternatives discussed with the patient who wishes to proceed.  Patient and I did a medical review of Rheumatoid arthritis and menstrual effect on symptoms and she understands that the removal of the ovaries is not a guarantee of no flares of her arthritic symptoms. Some patients do better in their secretory phase of the cycle according to Up To Date. She understands that without some form of hormonal replacement, she would be at increased risk of osteoporosis and heart disease. We may start with estrogen replacement and then transition to adding progesterone if needed. She understands that this is not the usual way to prescribe to a hysterectomized and oophorectomized patient.  Patient will not take Enbrel or methotrexate in the 7 days leading up to surgery.  An After Visit Summary was printed and given to the patient.  30 minutes face to face time of which over 50% was spent in counseling.     

## 2014-02-12 ENCOUNTER — Encounter (HOSPITAL_COMMUNITY): Payer: Self-pay

## 2014-02-19 NOTE — Patient Instructions (Addendum)
   Your procedure is scheduled on:  Tuesday, May 12  Enter through the Main Entrance of [facility] at:  6 AM Pick up the phone at the desk and dial [phone] and inform us of your arrival.  Please call this number if you have any problems the morning of surgery: [phone]  Remember: Do not eat or drink after midnight: Monday Take these medicines the morning of surgery with a SIP OF WATER: buspar, plaquenil, lamictal, zoloft, ativan if needed  Do not wear jewelry, make-up, or FINGER nail polish No metal in your hair or on your body. Do not wear lotions, powders, perfumes.  You may wear deodorant.  Do not bring valuables to the hospital. Contacts, dentures or bridgework may not be worn into surgery.  Leave suitcase in the car. After Surgery it may be brought to your room. For patients being admitted to the hospital, checkout time is 11:00am the day of discharge.    Patients discharged on the day of surgery will not be allowed to drive home.  Home with partner Jenny Bailey cell [phone].

## 2014-02-20 ENCOUNTER — Encounter (HOSPITAL_COMMUNITY): Payer: Self-pay

## 2014-02-20 ENCOUNTER — Encounter (HOSPITAL_COMMUNITY)
Admission: RE | Admit: 2014-02-20 | Discharge: 2014-02-20 | Disposition: A | Discharge status: Home / Self Care | Payer: BC Managed Care – PPO | Source: Ambulatory Visit | Attending: Obstetrics and Gynecology | Admitting: Obstetrics and Gynecology

## 2014-02-20 DIAGNOSIS — Z01811 Encounter for preprocedural respiratory examination: Secondary | ICD-10-CM | POA: Insufficient documentation

## 2014-02-20 DIAGNOSIS — Z01812 Encounter for preprocedural laboratory examination: Secondary | ICD-10-CM | POA: Insufficient documentation

## 2014-02-20 HISTORY — DX: Other seasonal allergic rhinitis: J30.2

## 2014-02-20 LAB — CBC
HCT: 35.3 % — ABNORMAL LOW (ref 36.0–46.0)
Hemoglobin: 11.4 g/dL — ABNORMAL LOW (ref 12.0–15.0)
MCH: 30.1 pg (ref 26.0–34.0)
MCHC: 32.3 g/dL (ref 30.0–36.0)
MCV: 93.1 fL (ref 78.0–100.0)
PLATELETS: 200 10*3/uL (ref 150–400)
RBC: 3.79 MIL/uL — ABNORMAL LOW (ref 3.87–5.11)
RDW: 14.1 % (ref 11.5–15.5)
WBC: 3.8 10*3/uL — ABNORMAL LOW (ref 4.0–10.5)

## 2014-02-25 ENCOUNTER — Encounter (HOSPITAL_COMMUNITY): Payer: Self-pay | Admitting: *Deleted

## 2014-02-25 ENCOUNTER — Encounter (HOSPITAL_COMMUNITY)
Admission: RE | Disposition: A | Discharge status: Home / Self Care | Payer: Self-pay | Source: Ambulatory Visit | Attending: Obstetrics and Gynecology

## 2014-02-25 ENCOUNTER — Encounter (HOSPITAL_COMMUNITY): Payer: BC Managed Care – PPO | Admitting: Certified Registered Nurse Anesthetist

## 2014-02-25 ENCOUNTER — Ambulatory Visit (HOSPITAL_COMMUNITY): Payer: BC Managed Care – PPO | Admitting: Certified Registered Nurse Anesthetist

## 2014-02-25 ENCOUNTER — Observation Stay (HOSPITAL_COMMUNITY)
Admission: RE | Admit: 2014-02-25 | Discharge: 2014-02-26 | Disposition: A | Discharge status: Home / Self Care | Payer: BC Managed Care – PPO | Source: Ambulatory Visit | Attending: Obstetrics and Gynecology | Admitting: Obstetrics and Gynecology

## 2014-02-25 DIAGNOSIS — D649 Anemia, unspecified: Secondary | ICD-10-CM | POA: Insufficient documentation

## 2014-02-25 DIAGNOSIS — N72 Inflammatory disease of cervix uteri: Secondary | ICD-10-CM | POA: Insufficient documentation

## 2014-02-25 DIAGNOSIS — N920 Excessive and frequent menstruation with regular cycle: Secondary | ICD-10-CM

## 2014-02-25 DIAGNOSIS — N921 Excessive and frequent menstruation with irregular cycle: Secondary | ICD-10-CM

## 2014-02-25 DIAGNOSIS — R112 Nausea with vomiting, unspecified: Secondary | ICD-10-CM | POA: Insufficient documentation

## 2014-02-25 DIAGNOSIS — D259 Leiomyoma of uterus, unspecified: Secondary | ICD-10-CM | POA: Insufficient documentation

## 2014-02-25 DIAGNOSIS — M069 Rheumatoid arthritis, unspecified: Secondary | ICD-10-CM | POA: Insufficient documentation

## 2014-02-25 DIAGNOSIS — N8300 Follicular cyst of ovary, unspecified side: Secondary | ICD-10-CM | POA: Insufficient documentation

## 2014-02-25 DIAGNOSIS — R9389 Abnormal findings on diagnostic imaging of other specified body structures: Secondary | ICD-10-CM | POA: Insufficient documentation

## 2014-02-25 DIAGNOSIS — F329 Major depressive disorder, single episode, unspecified: Secondary | ICD-10-CM | POA: Insufficient documentation

## 2014-02-25 DIAGNOSIS — F3289 Other specified depressive episodes: Secondary | ICD-10-CM | POA: Insufficient documentation

## 2014-02-25 DIAGNOSIS — Z90710 Acquired absence of both cervix and uterus: Secondary | ICD-10-CM | POA: Diagnosis present

## 2014-02-25 DIAGNOSIS — N831 Corpus luteum cyst of ovary, unspecified side: Secondary | ICD-10-CM | POA: Insufficient documentation

## 2014-02-25 DIAGNOSIS — N83209 Unspecified ovarian cyst, unspecified side: Secondary | ICD-10-CM | POA: Insufficient documentation

## 2014-02-25 DIAGNOSIS — N808 Other endometriosis: Secondary | ICD-10-CM

## 2014-02-25 HISTORY — PX: ROBOTIC ASSISTED TOTAL HYSTERECTOMY: SHX6085

## 2014-02-25 HISTORY — PX: CYSTOSCOPY: SHX5120

## 2014-02-25 LAB — COMPREHENSIVE METABOLIC PANEL WITH GFR
ALT: 11 U/L (ref 0–35)
AST: 18 U/L (ref 0–37)
Albumin: 3.8 g/dL (ref 3.5–5.2)
Alkaline Phosphatase: 52 U/L (ref 39–117)
BUN: 7 mg/dL (ref 6–23)
CO2: 26 meq/L (ref 19–32)
Calcium: 9.6 mg/dL (ref 8.4–10.5)
Chloride: 101 meq/L (ref 96–112)
Creatinine, Ser: 0.77 mg/dL (ref 0.50–1.10)
GFR calc Af Amer: >90 mL/min
GFR calc non Af Amer: >90 mL/min
Glucose, Bld: 89 mg/dL (ref 70–99)
Potassium: 4.4 meq/L (ref 3.7–5.3)
Sodium: 138 meq/L (ref 137–147)
Total Bilirubin: 0.2 mg/dL — ABNORMAL LOW (ref 0.3–1.2)
Total Protein: 6.9 g/dL (ref 6.0–8.3)

## 2014-02-25 SURGERY — ROBOTIC ASSISTED TOTAL HYSTERECTOMY
Anesthesia: General | Site: Ureter

## 2014-02-25 MED ORDER — FLUTICASONE PROPIONATE 50 MCG/ACT NA SUSP
1.0000 | Freq: Every day | NASAL | Status: DC
  Administered 2014-02-26: 1 via NASAL
  Filled 2014-02-25: qty 16

## 2014-02-25 MED ORDER — ROPIVACAINE HCL 5 MG/ML IJ SOLN
INTRAMUSCULAR | Status: AC
  Filled 2014-02-25: qty 30

## 2014-02-25 MED ORDER — HYDROMORPHONE HCL PF 1 MG/ML IJ SOLN
INTRAMUSCULAR | Status: DC | PRN
  Administered 2014-02-25: 1 mg via INTRAVENOUS

## 2014-02-25 MED ORDER — LORAZEPAM 0.5 MG PO TABS: 0.5000 mg | ORAL_TABLET | Freq: Four times a day (QID) | ORAL | Status: DC | PRN

## 2014-02-25 MED ORDER — GLYCOPYRROLATE 0.2 MG/ML IJ SOLN
INTRAMUSCULAR | Status: AC
  Filled 2014-02-25: qty 3

## 2014-02-25 MED ORDER — MIDAZOLAM HCL 2 MG/2ML IJ SOLN: 0.5000 mg | Freq: Once | INTRAMUSCULAR | Status: DC | PRN

## 2014-02-25 MED ORDER — PROPOFOL 10 MG/ML IV BOLUS
INTRAVENOUS | Status: DC | PRN
  Administered 2014-02-25: 200 mg via INTRAVENOUS

## 2014-02-25 MED ORDER — LACTATED RINGERS IV SOLN
INTRAVENOUS | Status: DC
  Administered 2014-02-25 (×2): via INTRAVENOUS

## 2014-02-25 MED ORDER — SODIUM CHLORIDE 0.9 % IJ SOLN
INTRAMUSCULAR | Status: DC | PRN
  Administered 2014-02-25: 60 mL

## 2014-02-25 MED ORDER — NEOSTIGMINE METHYLSULFATE 10 MG/10ML IV SOLN
INTRAVENOUS | Status: AC
  Filled 2014-02-25: qty 1

## 2014-02-25 MED ORDER — BUPIVACAINE HCL (PF) 0.25 % IJ SOLN
INTRAMUSCULAR | Status: AC
  Filled 2014-02-25: qty 30

## 2014-02-25 MED ORDER — HYDROXYCHLOROQUINE SULFATE 200 MG PO TABS
400.0000 mg | ORAL_TABLET | Freq: Every day | ORAL | Status: DC
  Administered 2014-02-26: 400 mg via ORAL
  Filled 2014-02-25 (×2): qty 2

## 2014-02-25 MED ORDER — LIDOCAINE HCL (CARDIAC) 20 MG/ML IV SOLN
INTRAVENOUS | Status: DC | PRN
  Administered 2014-02-25: 100 mg via INTRAVENOUS

## 2014-02-25 MED ORDER — IBUPROFEN 600 MG PO TABS
600.0000 mg | ORAL_TABLET | Freq: Four times a day (QID) | ORAL | Status: DC | PRN
Start: 2014-02-25 — End: 2014-02-26

## 2014-02-25 MED ORDER — NALOXONE HCL 0.4 MG/ML IJ SOLN: 0.4000 mg | INTRAMUSCULAR | Status: DC | PRN

## 2014-02-25 MED ORDER — LACTATED RINGERS IR SOLN
Status: DC | PRN
  Administered 2014-02-25: 3000 mL

## 2014-02-25 MED ORDER — HYDROMORPHONE 0.3 MG/ML IV SOLN
INTRAVENOUS | Status: DC
  Administered 2014-02-25: 0.4 mg via INTRAVENOUS
  Administered 2014-02-25: 19:00:00 via INTRAVENOUS
  Administered 2014-02-26: 1.99 mg via INTRAVENOUS
  Administered 2014-02-26: 0.599 mg via INTRAVENOUS
  Administered 2014-02-26: 0.999 mg via INTRAVENOUS
  Filled 2014-02-25: qty 25

## 2014-02-25 MED ORDER — LACTATED RINGERS IV SOLN
INTRAVENOUS | Status: DC
  Administered 2014-02-25 (×2): via INTRAVENOUS

## 2014-02-25 MED ORDER — KETOROLAC TROMETHAMINE 30 MG/ML IJ SOLN
INTRAMUSCULAR | Status: AC
  Filled 2014-02-25: qty 1

## 2014-02-25 MED ORDER — MIDAZOLAM HCL 2 MG/2ML IJ SOLN
INTRAMUSCULAR | Status: AC
  Filled 2014-02-25: qty 2

## 2014-02-25 MED ORDER — LACTATED RINGERS IV BOLUS (SEPSIS)
500.0000 mL | Freq: Once | INTRAVENOUS | Status: AC
Start: 2014-02-25 — End: 2014-02-25
  Administered 2014-02-25: 500 mL via INTRAVENOUS

## 2014-02-25 MED ORDER — OXYCODONE-ACETAMINOPHEN 5-325 MG PO TABS
1.0000 | ORAL_TABLET | ORAL | Status: DC | PRN
  Administered 2014-02-26: 1 via ORAL
  Filled 2014-02-25: qty 1

## 2014-02-25 MED ORDER — DEXAMETHASONE SODIUM PHOSPHATE 10 MG/ML IJ SOLN
INTRAMUSCULAR | Status: DC | PRN
  Administered 2014-02-25: 10 mg via INTRAVENOUS

## 2014-02-25 MED ORDER — KETOROLAC TROMETHAMINE 30 MG/ML IJ SOLN
INTRAMUSCULAR | Status: DC | PRN
  Administered 2014-02-25: 30 mg via INTRAVENOUS

## 2014-02-25 MED ORDER — BUSPIRONE HCL 15 MG PO TABS
15.0000 mg | ORAL_TABLET | Freq: Two times a day (BID) | ORAL | Status: DC
  Filled 2014-02-25 (×3): qty 1

## 2014-02-25 MED ORDER — NEOSTIGMINE METHYLSULFATE 10 MG/10ML IV SOLN
INTRAVENOUS | Status: DC | PRN
  Administered 2014-02-25: 3 mg via INTRAVENOUS

## 2014-02-25 MED ORDER — ROPIVACAINE HCL 5 MG/ML IJ SOLN
INTRAMUSCULAR | Status: DC | PRN
  Administered 2014-02-25: 60 mL

## 2014-02-25 MED ORDER — ONDANSETRON HCL 4 MG/2ML IJ SOLN
INTRAMUSCULAR | Status: DC | PRN
  Administered 2014-02-25 (×2): 2 mg via INTRAVENOUS

## 2014-02-25 MED ORDER — BUSPIRONE HCL 15 MG PO TABS
15.0000 mg | ORAL_TABLET | Freq: Every day | ORAL | Status: DC
  Administered 2014-02-26: 15 mg via ORAL
  Filled 2014-02-25: qty 1

## 2014-02-25 MED ORDER — KETOROLAC TROMETHAMINE 30 MG/ML IJ SOLN: 15.0000 mg | Freq: Once | INTRAMUSCULAR | Status: DC | PRN

## 2014-02-25 MED ORDER — DEXAMETHASONE SODIUM PHOSPHATE 10 MG/ML IJ SOLN
INTRAMUSCULAR | Status: AC
  Filled 2014-02-25: qty 1

## 2014-02-25 MED ORDER — SODIUM CHLORIDE 0.9 % IJ SOLN
INTRAMUSCULAR | Status: AC
  Filled 2014-02-25: qty 50

## 2014-02-25 MED ORDER — ONDANSETRON HCL 4 MG/2ML IJ SOLN
4.0000 mg | Freq: Four times a day (QID) | INTRAMUSCULAR | Status: DC | PRN
  Administered 2014-02-25: 4 mg via INTRAVENOUS
  Filled 2014-02-25: qty 2

## 2014-02-25 MED ORDER — MORPHINE SULFATE 4 MG/ML IJ SOLN
2.0000 mg | Freq: Once | INTRAMUSCULAR | Status: AC
  Administered 2014-02-25: 2 mg via INTRAVENOUS
  Filled 2014-02-25: qty 1

## 2014-02-25 MED ORDER — DEXTROSE 5 % IV SOLN
2.0000 g | INTRAVENOUS | Status: AC
  Administered 2014-02-25: 2 g via INTRAVENOUS
  Filled 2014-02-25: qty 2

## 2014-02-25 MED ORDER — MIDAZOLAM HCL 2 MG/2ML IJ SOLN
INTRAMUSCULAR | Status: DC | PRN
  Administered 2014-02-25: 0.5 mg via INTRAVENOUS
  Administered 2014-02-25: 1.5 mg via INTRAVENOUS

## 2014-02-25 MED ORDER — ONDANSETRON HCL 4 MG/2ML IJ SOLN
INTRAMUSCULAR | Status: AC
  Filled 2014-02-25: qty 2

## 2014-02-25 MED ORDER — ROCURONIUM BROMIDE 100 MG/10ML IV SOLN
INTRAVENOUS | Status: DC | PRN
  Administered 2014-02-25: 50 mg via INTRAVENOUS

## 2014-02-25 MED ORDER — FENTANYL CITRATE 0.05 MG/ML IJ SOLN: 25.0000 ug | INTRAMUSCULAR | Status: DC | PRN

## 2014-02-25 MED ORDER — DIPHENHYDRAMINE HCL 50 MG/ML IJ SOLN: 12.5000 mg | Freq: Four times a day (QID) | INTRAMUSCULAR | Status: DC | PRN

## 2014-02-25 MED ORDER — ROCURONIUM BROMIDE 100 MG/10ML IV SOLN
INTRAVENOUS | Status: AC
  Filled 2014-02-25: qty 1

## 2014-02-25 MED ORDER — LACTATED RINGERS IV SOLN: INTRAVENOUS | Status: DC

## 2014-02-25 MED ORDER — PROMETHAZINE HCL 25 MG/ML IJ SOLN: 6.2500 mg | INTRAMUSCULAR | Status: DC | PRN

## 2014-02-25 MED ORDER — GLYCOPYRROLATE 0.2 MG/ML IJ SOLN
INTRAMUSCULAR | Status: DC | PRN
  Administered 2014-02-25: 0.1 mg via INTRAVENOUS
  Administered 2014-02-25: 0.4 mg via INTRAVENOUS
  Administered 2014-02-25: 0.1 mg via INTRAVENOUS

## 2014-02-25 MED ORDER — KETOROLAC TROMETHAMINE 30 MG/ML IJ SOLN
30.0000 mg | Freq: Four times a day (QID) | INTRAMUSCULAR | Status: DC
  Administered 2014-02-25 – 2014-02-26 (×3): 30 mg via INTRAVENOUS
  Filled 2014-02-25 (×3): qty 1

## 2014-02-25 MED ORDER — MENTHOL 3 MG MT LOZG: 1.0000 | LOZENGE | OROMUCOSAL | Status: DC | PRN

## 2014-02-25 MED ORDER — PROPOFOL 10 MG/ML IV EMUL
INTRAVENOUS | Status: AC
  Filled 2014-02-25: qty 20

## 2014-02-25 MED ORDER — ARTIFICIAL TEARS OP OINT
TOPICAL_OINTMENT | OPHTHALMIC | Status: AC
  Filled 2014-02-25: qty 3.5

## 2014-02-25 MED ORDER — DIPHENHYDRAMINE HCL 12.5 MG/5ML PO ELIX: 12.5000 mg | ORAL_SOLUTION | Freq: Four times a day (QID) | ORAL | Status: DC | PRN

## 2014-02-25 MED ORDER — LAMOTRIGINE 150 MG PO TABS
150.0000 mg | ORAL_TABLET | Freq: Every day | ORAL | Status: DC
  Administered 2014-02-26: 150 mg via ORAL
  Filled 2014-02-25 (×2): qty 1

## 2014-02-25 MED ORDER — MEPERIDINE HCL 25 MG/ML IJ SOLN
6.2500 mg | INTRAMUSCULAR | Status: DC | PRN
  Administered 2014-02-25: 12.5 mg via INTRAVENOUS

## 2014-02-25 MED ORDER — SODIUM CHLORIDE 0.9 % IJ SOLN
INTRAMUSCULAR | Status: AC
  Filled 2014-02-25: qty 10

## 2014-02-25 MED ORDER — SODIUM CHLORIDE 0.9 % IJ SOLN: 9.0000 mL | INTRAMUSCULAR | Status: DC | PRN

## 2014-02-25 MED ORDER — LIDOCAINE HCL (CARDIAC) 20 MG/ML IV SOLN
INTRAVENOUS | Status: AC
  Filled 2014-02-25: qty 5

## 2014-02-25 MED ORDER — MEPERIDINE HCL 25 MG/ML IJ SOLN
INTRAMUSCULAR | Status: AC
  Filled 2014-02-25: qty 1

## 2014-02-25 MED ORDER — PROMETHAZINE HCL 25 MG/ML IJ SOLN
12.5000 mg | Freq: Four times a day (QID) | INTRAMUSCULAR | Status: DC | PRN
  Administered 2014-02-25: 12.5 mg via INTRAVENOUS
  Filled 2014-02-25: qty 1

## 2014-02-25 MED ORDER — ONDANSETRON HCL 4 MG PO TABS: 4.0000 mg | ORAL_TABLET | Freq: Four times a day (QID) | ORAL | Status: DC | PRN

## 2014-02-25 MED ORDER — ROCURONIUM BROMIDE 100 MG/10ML IV SOLN
INTRAVENOUS | Status: AC
Start: 2014-02-25 — End: 2014-02-25
  Filled 2014-02-25: qty 1

## 2014-02-25 MED ORDER — FENTANYL CITRATE 0.05 MG/ML IJ SOLN
INTRAMUSCULAR | Status: AC
  Filled 2014-02-25: qty 5

## 2014-02-25 MED ORDER — FENTANYL CITRATE 0.05 MG/ML IJ SOLN
INTRAMUSCULAR | Status: DC | PRN
  Administered 2014-02-25 (×2): 100 ug via INTRAVENOUS
  Administered 2014-02-25: 50 ug via INTRAVENOUS

## 2014-02-25 MED ORDER — HYDROMORPHONE HCL PF 1 MG/ML IJ SOLN
INTRAMUSCULAR | Status: AC
  Filled 2014-02-25: qty 1

## 2014-02-25 MED ORDER — SERTRALINE HCL 50 MG PO TABS
150.0000 mg | ORAL_TABLET | Freq: Every day | ORAL | Status: DC
  Administered 2014-02-26: 150 mg via ORAL
  Filled 2014-02-25 (×2): qty 1

## 2014-02-25 SURGICAL SUPPLY — 57 items
ADH SKN CLS APL DERMABOND .7 (GAUZE/BANDAGES/DRESSINGS) ×2
BAG URINE DRAINAGE (UROLOGICAL SUPPLIES) ×3 IMPLANT
BARRIER ADHS 3X4 INTERCEED (GAUZE/BANDAGES/DRESSINGS) IMPLANT
BRR ADH 4X3 ABS CNTRL BYND (GAUZE/BANDAGES/DRESSINGS)
CATH FOLEY 3WAY  5CC 16FR (CATHETERS) ×1
CATH FOLEY 3WAY 5CC 16FR (CATHETERS) ×2 IMPLANT
CLOTH BEACON ORANGE TIMEOUT ST (SAFETY) ×3 IMPLANT
CONT PATH 16OZ SNAP LID 3702 (MISCELLANEOUS) ×5 IMPLANT
COVER MAYO STAND STRL (DRAPES) ×3 IMPLANT
COVER TABLE BACK 60X90 (DRAPES) ×6 IMPLANT
COVER TIP SHEARS 8 DVNC (MISCELLANEOUS) ×4 IMPLANT
COVER TIP SHEARS 8MM DA VINCI (MISCELLANEOUS) ×2
DECANTER SPIKE VIAL GLASS SM (MISCELLANEOUS) ×6 IMPLANT
DERMABOND ADVANCED (GAUZE/BANDAGES/DRESSINGS) ×1
DERMABOND ADVANCED .7 DNX12 (GAUZE/BANDAGES/DRESSINGS) ×2 IMPLANT
DRAPE HUG U DISPOSABLE (DRAPE) ×3 IMPLANT
DRAPE LG THREE QUARTER DISP (DRAPES) ×6 IMPLANT
DRAPE WARM FLUID 44X44 (DRAPE) ×3 IMPLANT
DRSG TEGADERM 2.38X2.75 (GAUZE/BANDAGES/DRESSINGS) ×1 IMPLANT
ELECT REM PT RETURN 9FT ADLT (ELECTROSURGICAL) ×3
ELECTRODE REM PT RTRN 9FT ADLT (ELECTROSURGICAL) ×2 IMPLANT
EVACUATOR SMOKE 8.L (FILTER) ×3 IMPLANT
GAUZE VASELINE 3X9 (GAUZE/BANDAGES/DRESSINGS) IMPLANT
GLOVE BIO SURGEON STRL SZ 6.5 (GLOVE) ×9 IMPLANT
GLOVE BIOGEL PI IND STRL 6.5 (GLOVE) ×2 IMPLANT
GLOVE BIOGEL PI INDICATOR 6.5 (GLOVE) ×1
GOWN STRL REUS W/TWL LRG LVL3 (GOWN DISPOSABLE) ×22 IMPLANT
KIT ACCESSORY DA VINCI DISP (KITS) ×1
KIT ACCESSORY DVNC DISP (KITS) ×2 IMPLANT
LEGGING LITHOTOMY PAIR STRL (DRAPES) ×3 IMPLANT
NEEDLE INSUFFLATION 120MM (ENDOMECHANICALS) ×2 IMPLANT
OCCLUDER COLPOPNEUMO (BALLOONS) ×3 IMPLANT
PACK LAVH (CUSTOM PROCEDURE TRAY) ×3 IMPLANT
PAD PREP 24X48 CUFFED NSTRL (MISCELLANEOUS) ×6 IMPLANT
PLUG CATH AND CAP STER (CATHETERS) ×3 IMPLANT
PROTECTOR NERVE ULNAR (MISCELLANEOUS) ×9 IMPLANT
SET CYSTO W/LG BORE CLAMP LF (SET/KITS/TRAYS/PACK) ×1 IMPLANT
SET IRRIG TUBING LAPAROSCOPIC (IRRIGATION / IRRIGATOR) ×5 IMPLANT
SOLUTION ELECTROLUBE (MISCELLANEOUS) ×3 IMPLANT
SUT VIC AB 0 CT2 27 (SUTURE) ×12 IMPLANT
SUT VIC AB 4-0 PS2 27 (SUTURE) ×6 IMPLANT
SUT VICRYL 0 UR6 27IN ABS (SUTURE) ×6 IMPLANT
SUT VLOC 180 2-0 9IN GS21 (SUTURE) ×2 IMPLANT
SYR 50ML LL SCALE MARK (SYRINGE) ×6 IMPLANT
SYSTEM CONVERTIBLE TROCAR (TROCAR) IMPLANT
TIP RUMI ORANGE 6.7MMX12CM (TIP) IMPLANT
TIP UTERINE 5.1X6CM LAV DISP (MISCELLANEOUS) IMPLANT
TIP UTERINE 6.7X10CM GRN DISP (MISCELLANEOUS) IMPLANT
TIP UTERINE 6.7X6CM WHT DISP (MISCELLANEOUS) IMPLANT
TIP UTERINE 6.7X8CM BLUE DISP (MISCELLANEOUS) ×3 IMPLANT
TOWEL OR 17X24 6PK STRL BLUE (TOWEL DISPOSABLE) ×9 IMPLANT
TROCAR DILATING TIP 12MM 150MM (ENDOMECHANICALS) ×3 IMPLANT
TROCAR DISP BLADELESS 8 DVNC (TROCAR) ×2 IMPLANT
TROCAR DISP BLADELESS 8MM (TROCAR) ×1
TUBING FILTER THERMOFLATOR (ELECTROSURGICAL) ×3 IMPLANT
WARMER LAPAROSCOPE (MISCELLANEOUS) ×3 IMPLANT
WATER STERILE IRR 1000ML POUR (IV SOLUTION) ×9 IMPLANT

## 2014-02-25 NOTE — Anesthesia Preprocedure Evaluation (Signed)
Anesthesia Evaluation  Patient identified by MRN, date of birth, ID band Patient awake    Reviewed: Allergy & Precautions, H&P , Patient's Chart, lab work & pertinent test results, reviewed documented beta blocker date and time   History of Anesthesia Complications Negative for: history of anesthetic complications  Airway Mallampati: II  TM Distance: >3 FB Neck ROM: full    Dental   Pulmonary  breath sounds clear to auscultation        Cardiovascular Exercise Tolerance: Good Rhythm:regular Rate:Normal     Neuro/Psych    GI/Hepatic   Endo/Other    Renal/GU      Musculoskeletal   Abdominal   Peds  Hematology   Anesthesia Other Findings   Reproductive/Obstetrics                             Anesthesia Physical Anesthesia Plan  ASA: II  Anesthesia Plan: General ETT   Post-op Pain Management:    Induction:   Airway Management Planned:   Additional Equipment:   Intra-op Plan:   Post-operative Plan:   Informed Consent: I have reviewed the patients History and Physical, chart, labs and discussed the procedure including the risks, benefits and alternatives for the proposed anesthesia with the patient or authorized representative who has indicated his/her understanding and acceptance.   Dental Advisory Given  Plan Discussed with: CRNA and Surgeon  Anesthesia Plan Comments:         Anesthesia Quick Evaluation  

## 2014-02-25 NOTE — H&P (Signed)
Update to History and Physical  Has not taken methotrexate in 2 weeks and not taken Enbrel in last 1 week. Patient examined.  OK to proceed with robotic total laparoscopic hysterectomy with bilateral salpingo-oophorectomy and cystoscopy.

## 2014-02-25 NOTE — Anesthesia Postprocedure Evaluation (Signed)
  Anesthesia Post-op Note  Patient: Jenny Bailey  Procedure(s) Performed: Procedure(s): ROBOTIC ASSISTED TOTAL HYSTERECTOMY WITH BILATERAL SALPINGOOPHARECTOMY (Bilateral) CYSTOSCOPY (N/A)   Last Vitals:  Filed Vitals:   02/25/14 1124  BP: 110/56  Pulse: 92  Temp: 37.2 C  Resp: 19    Patient is awake and responsive. Pain and nausea are reasonably well controlled. Vital signs are stable and clinically acceptable. Oxygen saturation is clinically acceptable. There are no apparent anesthetic complications at this time. Patient is ready for discharge.

## 2014-02-25 NOTE — H&P (Signed)
Patient ID: Jenny Bailey, female DOB: 28-Dec-1965, 48 y.o. MRN: [id number]  GYNECOLOGY VISIT  PCP: Jonathon Jordan, MD  Referring provider:  HPI:  48 y.o. Partnered Caucasian female  G0P0 with Patient's last menstrual period 02-10-14.  here to discuss surgery.  Menses every 6 weeks.  Last 10 days.  Heavy flow and using pad plus tampon every 30 minutes.  Lightheaded.  No real pain.  When has menses has increased symptoms of rheumatoid arthritis. Takes Enbrel once a week on Thursdays and Metrotrexate once a week on Sundays.  Wants hysterectomy and ovarian removal.  Declines future childbearing.  Flares really affect the quality of patient's life and really do coincide.  Pelvic ultrasound 06/27/13 - normal uterus with no masses. Endometrium thickened and with echogenic focus 10 mm. Ovaries normal. No free fluid.  Sonohysterogram 06/27/13 showed multiple filling defects 1 mm and less of the endometrium and of the endocervix 8 mm and less.  Endometrial biopsy 06/27/13 showing proliferative endometrium and polypoid endometrium with blood vessesl suggestive of benign endometrial polyp, negative for hyperplasia and cancer.  GYNECOLOGIC HISTORY:  Patient's last menstrual period was 01/01/2014.  Sexually active: yes  Partner preference: female  Contraception: none  Menopausal hormone therapy: n/a  DES exposure: no  Blood transfusions: no  Sexually transmitted diseases: no  GYN procedures and prior surgeries: none  Last mammogram: 05/2012 wnl:[facility]. Did last week at [facility] Told it was normal by email.  Last pap and high risk HPV testing: 01/2012 wnl:neg HR HPV(with PCP)  History of abnormal pap smear: no  OB History    Grav  Para  Term  Preterm  Abortions  TAB  SAB  Ect  Mult  Living    0              LIFESTYLE:  Exercise: no  Tobacco: no  Alcohol: no  Drug use: no  Patient Active Problem List    Diagnosis  Date Noted   .  Left ovarian cyst  07/05/2013   .  Menorrhagia  06/28/2013    .  Metrorrhagia  06/28/2013   .  Endometrial thickening on ultra sound  06/28/2013    Past Medical History   Diagnosis  Date   .  Depression    .  Anxiety      situational   .  RA (rheumatoid arthritis)  2013    Past Surgical History   Procedure  Laterality  Date   .  Wisdom tooth extraction   2007    Current Outpatient Prescriptions   Medication  Sig  Dispense  Refill   .  busPIRone (BUSPAR) 15 MG tablet  Take 15 mg by mouth.     .  cholecalciferol (VITAMIN D) 1000 UNITS tablet  Take 1,000 Units by mouth daily.     Marland Kitchen  etanercept (ENBREL) 50 MG/ML injection  Inject 50 mg into the skin once a week.     .  fluticasone (FLONASE) 50 MCG/ACT nasal spray  Place 1 spray into both nostrils daily.     .  folic acid (FOLVITE) 1 MG tablet  Take 1 mg by mouth.     .  hydroxychloroquine (PLAQUENIL) 200 MG tablet  Take 200 mg by mouth daily.     Marland Kitchen  lamoTRIgine (LAMICTAL) 100 MG tablet  Take 100 mg by mouth.     Marland Kitchen  LORazepam (ATIVAN) 0.5 MG tablet  Take 0.5 mg by mouth as needed.     .  methotrexate (  RHEUMATREX) 2.5 MG tablet  Take 2.5 mg by mouth.     .  Multiple Vitamin (MULTIVITAMIN) tablet  Take 1 tablet by mouth daily.     .  sertraline (ZOLOFT) 50 MG tablet  Take 50 mg by mouth daily. 3 tabs daily     .  Turmeric 450 MG CAPS  Take by mouth.      No current facility-administered medications for this visit.   ALLERGIES: Review of patient's allergies indicates no known allergies.  Family History   Problem  Relation  Age of Onset   .  Thyroid disease  Mother      hypothyroid   .  Heart failure  Father    .  Dementia  Father    .  Stroke  Father    .  Hypertension  Father    .  Psoriasis  Father    .  Lupus  Maternal Grandmother    .  Breast cancer  Maternal Grandmother  60    History    Social History   .  Marital Status:  Unknown     Spouse Name:  N/A     Number of Children:  N/A   .  Years of Education:  N/A    Occupational History   .  Not on file.    Social History  Main Topics   .  Smoking status:  Never Smoker   .  Smokeless tobacco:  Never Used   .  Alcohol Use:  No   .  Drug Use:  No   .  Sexual Activity:  Yes     Partners:  Female     Patent examiner Protection:  None    Other Topics  Concern   .  Not on file    Social History Narrative   .  No narrative on file   ROS: Pertinent items are noted in HPI.  PHYSICAL EXAMINATION:  LMP 01/01/2014  Wt Readings from Last 3 Encounters:   01/15/14  245 lb 3.2 oz (111.222 kg)   07/05/13  246 lb (111.585 kg)   06/27/13  245 lb 8 oz (111.358 kg)    Ht Readings from Last 3 Encounters:   01/15/14  5\' 11"  (1.803 m)   07/05/13  5\' 11"  (1.803 m)   06/27/13  5\' 11"  (1.803 m)   General appearance: alert, cooperative and appears stated age  Head: Normocephalic, without obvious abnormality, atraumatic  Neck: no adenopathy, supple, symmetrical, trachea midline and thyroid not enlarged, symmetric, no tenderness/mass/nodules  Lungs: clear to auscultation bilaterally  Breasts: Inspection negative, No nipple retraction or dimpling, No nipple discharge or bleeding, No axillary or supraclavicular adenopathy, Normal to palpation without dominant masses  Heart: regular rate and rhythm  Abdomen: soft, non-tender; no masses, no organomegaly  Extremities: extremities normal, atraumatic, no cyanosis or edema  Skin: Skin color, texture, turgor normal. No rashes or lesions  Lymph nodes: Cervical, supraclavicular, and axillary nodes normal.  No abnormal inguinal nodes palpated  Neurologic: Grossly normal  Pelvic: External genitalia: no lesions  Urethra: normal appearing urethra with no masses, tenderness or lesions  Bartholins and Skenes: normal  Vagina: normal appearing vagina with normal color and discharge, no lesions  Cervix: normal appearance  Bimanual Exam: Uterus: uterus is normal size, shape, consistency and nontender  Adnexa: normal adnexa in size, nontender and no masses  ASSESSMENT  Menorrhagia.  History  of rheumatoid arthritis. On Enbrel and Methotrexate.  PLAN  Robotic total laparoscopic  hysterectomy with bilateral salpingo-oophorectomy.  Risks, benefits, and alternatives discussed with the patient who wishes to proceed.  Patient and I did a medical review of Rheumatoid arthritis and menstrual effect on symptoms and she understands that the removal of the ovaries is not a guarantee of no flares of her arthritic symptoms. Some patients do better in their secretory phase of the cycle according to Up To Date. She understands that without some form of hormonal replacement, she would be at increased risk of osteoporosis and heart disease. We may start with estrogen replacement and then transition to adding progesterone if needed. She understands that this is not the usual way to prescribe to a hysterectomized and oophorectomized patient.  Patient will not take Enbrel or methotrexate in the 7 days leading up to surgery.  An After Visit Summary was printed and given to the patient.  30 minutes face to face time of which over 50% was spent in counseling.

## 2014-02-25 NOTE — Anesthesia Postprocedure Evaluation (Signed)
  Anesthesia Post-op Note  Patient: Jenny Bailey  Procedure(s) Performed: Procedure(s): ROBOTIC ASSISTED TOTAL HYSTERECTOMY WITH BILATERAL SALPINGOOPHARECTOMY (Bilateral) CYSTOSCOPY (N/A)  Patient Location: [facility]  Anesthesia Type:General  Level of Consciousness: awake  Airway and Oxygen Therapy: Patient Spontanous Breathing  Post-op Pain: mild  Post-op Assessment: Patient's Cardiovascular Status Stable and Respiratory Function Stable  Post-op Vital Signs: stable  Last Vitals:  Filed Vitals:   02/25/14 1245  BP: 119/59  Pulse: 95  Temp: 36.8 C  Resp: 18    Complications: No apparent anesthesia complications

## 2014-02-25 NOTE — Progress Notes (Signed)
Day of Surgery Procedure(s) (LRB): ROBOTIC ASSISTED TOTAL HYSTERECTOMY WITH BILATERAL SALPINGOOPHARECTOMY (Bilateral) CYSTOSCOPY (N/A)  Subjective: Patient reports nausea and vomiting.   Received Morphine 2 mg twice since arriving to her room on [facility].  Received Zofran. Had I and O catheterization for 200 cc.  Bladder scan showed volume of over 100 cc and cathed for 200 cc.   Objective: I have reviewed patient's vital signs and intake and output.  General: alert Resp: clear to auscultation bilaterally Cardio: regular rate and rhythm, S1, S2 normal, no murmur, click, rub or gallop GI: soft, non-tender; bowel sounds normal; no masses,  no organomegaly and incision: clean, dry, intact and Umbilical incision with dressing showing blood staining - no active bleeding.  Extremities:  PAS and Ted hose on.  DPs 1+ bilaterally.  Vaginal Bleeding: minimal  Assessment: s/p Procedure(s): ROBOTIC ASSISTED TOTAL HYSTERECTOMY WITH BILATERAL SALPINGOOPHARECTOMY (Bilateral) CYSTOSCOPY (N/A): stable and Nausea and vomiting post op.  Plan:  IVF bolus now of LR 500 cc. Will start Dilaudid PCA. Will add Phenergan for antiemetic. CBC in am.  Surgical findings and procedure discussed with patient.    LOS: 0 days    [place] 02/25/2014, 5:37 PM

## 2014-02-25 NOTE — Addendum Note (Signed)
Addendum created 02/25/14 1419 by Ignacia Bayley, CRNA   Modules edited: Notes Section   Notes Section:  File: [id number]

## 2014-02-25 NOTE — Brief Op Note (Signed)
02/25/2014  9:54 AM  PATIENT:  [name]  48 y.o. female  PRE-OPERATIVE DIAGNOSIS:  menometrorrhagia, anemia  POST-OPERATIVE DIAGNOSIS:  menometrorrhagia, anemia  PROCEDURE:  Procedure(s): ROBOTIC ASSISTED TOTAL HYSTERECTOMY WITH BILATERAL SALPINGOOPHARECTOMY (Bilateral) CYSTOSCOPY (N/A)  SURGEON:  Surgeon(s) and Role:    * Nova Schmuhl E Amundson de Berton Lan, MD - Primary    * Lyman Speller, MD - Assisting  PHYSICIAN ASSISTANT:   ASSISTANTS: Lyman Speller, MD   ANESTHESIA:   local, general and Intraperitoneal Ropivicaine  EBL:  Total I/O In: 500 [I.V.:500] Out: 250 [Urine:200; Blood:50]  BLOOD ADMINISTERED:none  DRAINS: none   LOCAL MEDICATIONS USED:  OTHER  Ropivicaine  SPECIMEN:  Source of Specimen:  Uterus, cervix, bilateral tubes and ovaries  DISPOSITION OF SPECIMEN:  PATHOLOGY  COUNTS:  YES  TOURNIQUET:  * No tourniquets in log *  DICTATION: .Other Dictation: Dictation Number    PLAN OF CARE: Admit for overnight observation  PATIENT DISPOSITION:  PACU - hemodynamically stable.   Delay start of Pharmacological VTE agent (>24hrs) due to surgical blood loss or risk of bleeding: not applicable

## 2014-02-25 NOTE — Transfer of Care (Signed)
Immediate Anesthesia Transfer of Care Note  Patient: Jenny Bailey  Procedure(s) Performed: Procedure(s): ROBOTIC ASSISTED TOTAL HYSTERECTOMY WITH BILATERAL SALPINGOOPHARECTOMY (Bilateral) CYSTOSCOPY (N/A)  Patient Location: PACU  Anesthesia Type:General  Level of Consciousness: awake, alert  and oriented  Airway & Oxygen Therapy: Patient Spontanous Breathing and Patient connected to nasal cannula oxygen  Post-op Assessment: Report given to PACU RN, Post -op Vital signs reviewed and stable and Patient moving all extremities X 4  Post vital signs: Reviewed and stable  Complications: No apparent anesthesia complications

## 2014-02-26 ENCOUNTER — Encounter (HOSPITAL_COMMUNITY): Payer: Self-pay | Admitting: Obstetrics and Gynecology

## 2014-02-26 LAB — CBC
HEMATOCRIT: 31.8 % — AB (ref 36.0–46.0)
HEMOGLOBIN: 10.3 g/dL — AB (ref 12.0–15.0)
MCH: 29.9 pg (ref 26.0–34.0)
MCHC: 32.4 g/dL (ref 30.0–36.0)
MCV: 92.4 fL (ref 78.0–100.0)
Platelets: 197 10*3/uL (ref 150–400)
RBC: 3.44 MIL/uL — ABNORMAL LOW (ref 3.87–5.11)
RDW: 14 % (ref 11.5–15.5)
WBC: 7.9 10*3/uL (ref 4.0–10.5)

## 2014-02-26 MED ORDER — IBUPROFEN 600 MG PO TABS: 600.0000 mg | ORAL_TABLET | Freq: Four times a day (QID) | ORAL | Status: DC | PRN

## 2014-02-26 MED ORDER — OXYCODONE-ACETAMINOPHEN 5-325 MG PO TABS: 1.0000 | ORAL_TABLET | ORAL | Status: DC | PRN

## 2014-02-26 NOTE — Progress Notes (Signed)
1 Day Post-Op Procedure(s) (LRB): ROBOTIC ASSISTED TOTAL HYSTERECTOMY WITH BILATERAL SALPINGOOPHARECTOMY (Bilateral) CYSTOSCOPY (N/A)  Subjective: Patient reports no problems voiding.   Nausea and vomiting resolved with Phenergan. Did not need much pain medication overnight.  Objective: I have reviewed patient's vital signs, intake and output and labs.  General: alert Resp: clear to auscultation bilaterally Cardio: regular rate and rhythm, S1, S2 normal, no murmur, click, rub or gallop GI: soft, non-tender; bowel sounds normal; no masses,  no organomegaly and incision: clean, dry, intact and serous drainage present Vaginal Bleeding: none Drainage noted in the umbilical incision.   Assessment: s/p Procedure(s): ROBOTIC ASSISTED TOTAL HYSTERECTOMY WITH BILATERAL SALPINGOOPHARECTOMY (Bilateral) CYSTOSCOPY (N/A): stable  Plan: Advance diet Encourage ambulation Advance to PO medication Discharge home Prescriptions and instructions given.  Follow up in one week.   LOS: 1 day    [place] 02/26/2014, 6:39 AM

## 2014-02-26 NOTE — Progress Notes (Signed)
Pt discharged home with significant other after tolerating breakfast with no nausea... Condition stable... No equipment... Taken to car via wheelchair by V. Eros, NT.

## 2014-02-26 NOTE — Discharge Instructions (Signed)
Total Laparoscopic Hysterectomy, Care After  Refer to this sheet in the next few weeks. These instructions provide you with information on caring for yourself after your procedure. Your health care provider may also give you more specific instructions. Your treatment has been planned according to current medical practices, but problems sometimes occur. Call your health care provider if you have any problems or questions after your procedure.  WHAT TO EXPECT AFTER THE PROCEDURE  · Pain and bruising at the incision sites. You will be given pain medicine to control it.  · Menopausal symptoms such as hot flashes, night sweats, and insomnia if your ovaries were removed.  · Sore throat from the breathing tube that was inserted during surgery.  HOME CARE INSTRUCTIONS  · Only take over-the-counter or prescription medicines for pain, discomfort, or fever as directed by your health care provider.    · Do not take aspirin. It can cause bleeding.    · Do not drive when taking pain medicine.    · Follow your health care provider's advice regarding diet, exercise, lifting, driving, and general activities.    · Resume your usual diet as directed and allowed.    · Get plenty of rest and sleep.    · Do not douche, use tampons, or have sexual intercourse for at least 6 weeks, or until your health care provider gives you permission.    · Change your bandages (dressings) as directed by your health care provider.    · Monitor your temperature and notify your health care provider of a fever.    · Take showers instead of baths for 2 3 weeks.    · Do not drink alcohol until your health care provider gives you permission.    · If you develop constipation, you may take a mild laxative with your health care provider's permission. Bran foods may help with constipation problems. Drinking enough fluids to keep your urine clear or pale yellow may help as well.    · Try to have someone home with you for 1 2 weeks to help around the house.     · Keep all of your follow-up appointments as directed by your health care provider.    SEEK MEDICAL CARE IF:  · You have swelling, redness, or increasing pain around your incision sites.    · You have pus coming from your incision.    · You notice a bad smell coming from your incision.    · Your incision breaks open.    · You feel dizzy or lightheaded.    · You have pain or bleeding when you urinate.    · You have persistent diarrhea.    · You have persistent nausea and vomiting.    · You have abnormal vaginal discharge.    · You have a rash.    · You have any type of abnormal reaction or develop an allergy to your medicine.    · You have poor pain control with your prescribed medicine.    SEEK IMMEDIATE MEDICAL CARE IF:  · You have chest pain or shortness of breath.  · You have severe abdominal pain that is not relieved with pain medicine.  · You have pain or swelling in your legs.  Document Released: 07/24/2013 Document Reviewed: 04/23/2013  [employer]® Patient Information ©2014 [employer].

## 2014-02-26 NOTE — Op Note (Signed)
Jenny Bailey, Jenny Bailey                 ACCOUNT NO.:  [id number]  MEDICAL RECORD NO.:  [id number]  LOCATION:  9309                          FACILITY:  [facility]  PHYSICIAN:  Lenard Galloway, M.D.   DATE OF BIRTH:  21-May-1966  DATE OF PROCEDURE:  02/25/2014 DATE OF DISCHARGE:                              OPERATIVE REPORT   PREOPERATIVE DIAGNOSIS:  Menometrorrhagia.  POSTOPERATIVE DIAGNOSIS:  Menometrorrhagia, bilateral ovarian cysts, mild endometriosis.  PROCEDURE:  Da Vinci robotic total laparoscopic hysterectomy with bilateral salpingo-oophorectomy and cystoscopy.  SURGEON:  Lenard Galloway, M.D.  ASSISTANT:  Lyman Speller, MD.  ANESTHESIA:  General endotracheal, intraperitoneal ropivacaine 60 mL with 60 mL of normal saline, local with 10 mL of ropivacaine.  IV FLUIDS FOR THE PROCEDURE:  500 mL Ringer's lactate.  EBL:  50 mL.  URINE OUTPUT:  200 mL.  COMPLICATIONS:  None.  INDICATIONS FOR THE PROCEDURE:  The patient is a 48 year old gravida 0, para 0, Caucasian female who presents with heavy menstrual flow and irregular menses, at times twice in 1 month.  The patient has had heavy flow requiring tampon changes every 30 minutes.  The patient underwent an endometrial biopsy which showed proliferative endometrium with polypoid endometrium suggestive of a benign polyp.  There was no evidence of hyperplasia or malignancy.  A sonohysterogram did demonstrate multiple filling defects of the endometrium and the endocervix.  The patient declines conservative therapy and she declines any future childbearing.  The patient is requesting removal of the bilateral tubes and ovaries.  A plan is made to proceed with a robotic total laparoscopic hysterectomy with bilateral salpingo-oophorectomy and cystoscopy after risks, benefits, and alternatives have been reviewed. Hormone therapy has been discussed with the patient preoperatively, and she accepts this for treatment of surgical  menopause.  FINDINGS:  Examination under anesthesia revealed a small mid position mobile uterus.  No adnexal masses are appreciated.  At the time of laparoscopy, the patient was noted to have a normal uterus.  There was a lesion of endometriosis at the top of the left uterosacral ligament which included an area of approximately 1 cm of cherry red lesions and puckering of the peritoneum.  There were cysts of the bilateral ovaries which appeared to be simple cyst.  The fallopian tubes were unremarkable.  The appendix was normal.  The upper abdomen demonstrated no adhesive disease and the liver was unremarkable.  Cystoscopy at  termination of the procedure documented the bladder to be intact throughout 360 degrees including the bladder dome and trigone. The ureters were patent bilaterally.  SPECIMEN:  The uterus, cervix, and bilateral tubes and ovaries were sent to pathology.  PROCEDURE IN DETAIL:  The patient was reidentified in the preoperative hold area.  She received cefotetan 2 g IV for antibiotic prophylaxis. The patient received both TED hose and PAS stockings for DVT prophylaxis.  In the operating room, the patient was placed in the supine position and general endotracheal anesthesia was induced.  The patient was then placed in the [place] and her arms were tucked at her side.  The patient was appropriately padded for robotic procedure and had head protection and goggles over her  eyes.  The bean bag was inflated.  The patient's abdomen and vagina were then sterilely prepped and draped.  Her examination under anesthesia was performed prior to prepping and draping the patient.  A 12 mm supraumbilical incision was created with a scalpel after the skin was injected locally with ropivacaine.  A Veress needle was used to enter the peritoneal cavity in order to achieve a CO2 pneumoperitoneum. The trocar was then placed into the peritoneal cavity and the laparoscope  confirmed proper placement.  The speculum was placed inside the vagina.  The anterior cervical lip was grasped with a single-tooth tenaculum.  A figure-of-eight suture of 0 Vicryl was placed on the cervix at the 12 o'clock position and the 6 o'clock position.  The RUMI with the KOH ring were then placed using a #8 tip as the uterus had been sounded to 9 cm.  The balloon was inflated and all of the other vaginal instrumentation was removed.  A Foley catheter was placed inside the bladder and left to gravity drainage throughout the procedure.  The abdominal wall was mapped for the placement of the robotic ports and the assistant port.  The robotic port sites were placed 1 handsbreadth to the right and left of the midline approximately at the level of the umbilicus and slightly below.  The assistant port was placed in the right lower quadrant  using a 5 mm trocar.  All of the incisions were injected locally with ropivacaine prior to creating the incisions with a scalpel and then placing all of the trocars under direct visualization of the laparoscope.  The patient was then placed in the Trendelenburg position, and the robot was docked.  The #1 robotic arm contained the laparoscopic scissors and the #2 arm contained the PK bipolar instrument.  These instruments were placed under direct visualization of the laparoscope.  At this time, I stepped to the surgeon's console table and began the procedure.  The ureter was identified along the left pelvic sidewall. The left infundibulopelvic ligament and the left round ligament were noted to be very close to each other.  Dissection began along the left infundibulopelvic ligament.  The ligament was cauterized with the bipolar, cautery instrument was partially bisected.  It was very thick, and so decision was made to proceed with the isolation of the left round ligament.  The left round ligament was cauterized with the PK instrument multiple times  and then was bisected with the laparoscopic scissors. The remainder of the left infundibulopelvic ligament could then be grasped adequately with the PK instrument, cauterized, and then bisected with the monopolar scissors.  Dissection through the broad ligament was then performed using monopolar cautery and the laparoscopic scissors. This was performed anteriorly and posteriorly.  The bladder flap was taken down anteriorly, again using the laparoscopic scissors and monopolar energy.  The left uterine artery was skeletonized, again using monopolar cautery.  The uterine artery was then cauterized multiple times with the PK instrument and was bisected with the monopolar scissors.  Hemostasis was good.  The same procedure was then performed on the patient's right-hand side. The right ureter was identified.  The right infundibulopelvic ligament was cauterized with the PK instrument and then bisected with monopolar scissors.  The right round ligament was cauterized with the PK instrument and was bisected with monopolar scissors.  The broad ligament was then opened anteriorly and posteriorly using the monopolar scissors. The remainder of the bladder flap was taken down anteriorly using the monopolar cautery scissors.  The right uterine artery was skeletonized with the monopolar scissors.  It was then cauterized multiple times with the PK bipolar instrument and then bisected with the monopolar laparoscopic scissors.  The KOH ring was carefully identified throughout 360 degrees around the cervix.  The monopolar energy on the scissors was then used to make the colpotomy incision which began anteriorly and continued around the KOH ring in a clockwise fashion.  At this time, the specimen was completely freed and then was removed through the vaginal incision and sent to pathology.  The vaginal occluder balloon was inflated.  The PK and the monopolar laparoscopic scissors were removed and  were replaced with a ProGrasp and laparoscopic suture cut instrument.  The 0 V-loc suture was then used to close the colpotomy incision in a running fashion.  The closure began at the patient's right vaginal apex and continued to the left and then back two sutures toward the midline. Hemostasis was excellent along the cuff.  The pelvis was irrigated and all pedicles were examined and found to be hemostatic.  The laparoscopic needle was removed from the peritoneal cavity at this time.  The remaining ropivacaine was instilled in the peritoneal cavity at this time.  The trocars were removed under visualization of the laparoscope.  Manual breaths were given to the patient to remove any remaining pneumoperitoneum from the umbilical trocar and the umbilical trocar was removed last.  The umbilical fascia was closed with a figure-of-eight suture of 0 Vicryl.  Care was taken to ensure that there was no bowel in the fascia closure.  All of the skin incisions were closed with subcuticular sutures of 4-0 Vicryl and Dermabond.  Cystoscopy was performed after Foley catheter was removed.  The findings are as noted above.  All of the cystoscopic fluid was drained from the bladder and the Foley catheter was not replaced.  Vaginal examination demonstrated good hemostasis at the vaginal cuff which was closed.  There was very little blood in the vagina which was removed with a sponge on a stick.  The patient was cleansed of any remaining Betadine at this time.  She was taken out of the dorsal lithotomy position.  She was awakened and extubated and escorted to the recovery room in stable condition.  There were no complications to the procedure.  All needle, instrument, and sponge counts were correct.     Lenard Galloway, M.D.    BES/MEDQ  D:  02/25/2014  T:  02/26/2014  Job:  [id number]

## 2014-02-27 ENCOUNTER — Telehealth: Payer: Self-pay | Admitting: *Deleted

## 2014-02-27 NOTE — Telephone Encounter (Signed)
Message copied by Jaymes Graff on Thu Feb 27, 2014 12:46 PM ------      Message from: [place], [place]: Wed Feb 26, 2014  6:28 PM       Please inform patient of benign surgical pathology.       Benign ovarian cysts and benign uterine fibroid.  No endometrial polyps.      No cancer or precancer identified. ------

## 2014-02-27 NOTE — Telephone Encounter (Signed)
Call to patient cell number, [id number].

## 2014-02-28 ENCOUNTER — Telehealth: Payer: Self-pay | Admitting: Obstetrics and Gynecology

## 2014-02-28 ENCOUNTER — Other Ambulatory Visit: Payer: Self-pay | Admitting: Obstetrics and Gynecology

## 2014-02-28 MED ORDER — TRAMADOL HCL 50 MG PO TABS: 50.0000 mg | ORAL_TABLET | Freq: Four times a day (QID) | ORAL | Status: DC | PRN

## 2014-02-28 NOTE — Telephone Encounter (Signed)
Patient says the pain medication she was given after surgery is making her nauseated. Please call ASAP.

## 2014-02-28 NOTE — Telephone Encounter (Signed)
Patient notified that Dr Quincy Simmonds has sent new RX for Tramadol.  Patient state she has taken this before and tolerated it well. Call PRN.  Encounter closed.

## 2014-02-28 NOTE — Telephone Encounter (Signed)
Return call to patient, state she is doing well other than problem with nausea which is causing her to not be able to eat.  States pain is 4/10.  Mostly stomach and headache. Not really incisional pain and incisions without redness or swelling. Denies temp. Voiding well. Passing flatus. She feels Motrin 600 mg is also too strong for her stomach and she decreased to 400 mg.  Requests different pain medication.   Notified of benign path report as directed by Dr Quincy Simmonds.

## 2014-02-28 NOTE — Telephone Encounter (Signed)
I will sent Tramadol to the pharmacy for her. She can take 50 mg po qid prn.

## 2014-02-28 NOTE — Telephone Encounter (Signed)
Pt calling to check on a Rx for tramadol that should have been sent to her pharmacy. [place] at [phone].

## 2014-02-28 NOTE — Telephone Encounter (Signed)
Rx faxed to pharmacy. Patient notified. 

## 2014-03-02 NOTE — Discharge Summary (Signed)
Physician Discharge Summary  Patient ID: Jenny Bailey MRN: [id number] DOB/AGE: [birth year] 48 y.o.  Admit date: 02/25/2014 Discharge date: 02/26/14.  Admission Diagnoses:  Menorrhagia, Metrorrhagia.  Discharge Diagnoses:  1. Menorrhagia, 2. Metrorrhagia, 3. Mild endometriosis, 4. Bilateral Ovarian Cysts, 5. Status post robotic total laparoscopic hysterectomy with bilateral salpingo-oophorectomy and cystoscopy   Active Problems:   Status post laparoscopic hysterectomy   Discharged Condition: good  Hospital Course:  The patient was admitted on 02/25/14 for a robotic total laparoscopic hysterectomy with bilateral salpingo-oophorectomy and cystoscopy for menometrorrhagia and suspected endometrial polyps.  Surgery was performed without complication.  Findings at surgery included mild endometriosis of the left uterosacral ligament and bilateral ovarian cysts.   The patient's post op course was significant for nausea and vomiting with narcotic use.  Morphine sulfate was discontinued and replaced with dilaudid.  She was treated also with Ketorolac.  The patient was sent home with a prescription for Percocet and Motrin.  The patient tolerated a regular diet.  On post op day one, the patient had very mild urinary retention and was I and O catheterrized once for 200 cc of concentrated urine.  She received one bolus of 500 cc LR.  She had good urine output.  The patient ambulated successfully prior to discharge and she wore Ted hose and PAS stockings for DVT prophylaxis.  Her vital signs remained stable and she demonstrated no signs of infection during her hospitalization.  Her incisions were clean, dry, and intact.  She had minor ecchymoses of the abdominal wall.  Her vaginal bleeding was minimal.  Her discharge hemoglobin was 10.3, and her final pathology report was pending at the time of discharge.   Consults: None  Significant Diagnostic Studies: labs:  Hgb 10.3 on 02/26/14.  Treatments: surgery:  Status  post robotic total laparoscopic hysterectomy with bilateral salpingo-oophorectomy and cystoscopy performed on 02/25/14 under the direction of Dr. Josefa Half and with the assistance of Dr. Jerilynn Mages. Edwinna Areola.   Discharge Exam: Blood pressure 117/57, pulse 85, temperature 98.2 F (36.8 C), temperature source Oral, resp. rate 18, height 6' (1.829 m), weight 243 lb (110.224 kg), SpO2 98.00%. General appearance: alert Resp: clear to auscultation bilaterally Cardio: regular rate and rhythm, S1, S2 normal, no murmur, click, rub or gallop GI: soft, non-tender; bowel sounds normal; no masses,  no organomegaly Vaginal pad - essentially no bleeding. Incisions - clean, dry, intact, minor ecchymoses of abdominal wall.  Disposition: 01-Home or Self Care  Instructions and precautions reviewed.     Medication List    STOP taking these medications       acetaminophen 500 MG tablet  Commonly known as:  TYLENOL      TAKE these medications       busPIRone 15 MG tablet  Commonly known as:  BUSPAR  Take 15 mg by mouth.     cholecalciferol 1000 UNITS tablet  Commonly known as:  VITAMIN D  Take 1,000 Units by mouth daily.     etanercept 50 MG/ML injection  Commonly known as:  ENBREL  Inject 50 mg into the skin once a week. Every Thursday     fluticasone 50 MCG/ACT nasal spray  Commonly known as:  FLONASE  Place 1 spray into both nostrils daily.     folic acid 1 MG tablet  Commonly known as:  FOLVITE  Take 1 mg by mouth.     hydroxychloroquine 200 MG tablet  Commonly known as:  PLAQUENIL  Take 400 mg by mouth daily.  ibuprofen 600 MG tablet  Commonly known as:  ADVIL,MOTRIN  Take 1 tablet (600 mg total) by mouth every 6 (six) hours as needed (mild pain).     LAMICTAL 150 MG tablet  Generic drug:  lamoTRIgine  Take 150 mg by mouth daily.     LORazepam 0.5 MG tablet  Commonly known as:  ATIVAN  Take 0.5 mg by mouth as needed.     methotrexate 2.5 MG tablet  Commonly known as:   RHEUMATREX  Take 25 mg by mouth once a week. Every Sunday.  Takes 10 of the 2.5 mg tablets to = 25 mg dose     multivitamin tablet  Take 1 tablet by mouth daily.     oxyCODONE-acetaminophen 5-325 MG per tablet  Commonly known as:  PERCOCET/ROXICET  Take 1-2 tablets by mouth every 4 (four) hours as needed for severe pain (moderate to severe pain (when tolerating fluids)).     ZOLOFT 50 MG tablet  Generic drug:  sertraline  Take 150 mg by mouth daily. 3 tabs daily           Follow-up Information   Follow up with Amundson de Berton Lan, MD On 03/03/2014. (post op check)    Specialty:  Obstetrics and Gynecology   Contact information:   [address] [place] [address] [phone]       Signed: Odelia Gage 03/02/2014, 11:09 AM

## 2014-03-03 ENCOUNTER — Ambulatory Visit (INDEPENDENT_AMBULATORY_CARE_PROVIDER_SITE_OTHER): Payer: BC Managed Care – PPO | Admitting: Obstetrics and Gynecology

## 2014-03-03 ENCOUNTER — Encounter: Payer: Self-pay | Admitting: Obstetrics and Gynecology

## 2014-03-03 VITALS — BP 102/60 | HR 96 | Temp 98.8°F | Wt 241.0 lb

## 2014-03-03 DIAGNOSIS — Z9889 Other specified postprocedural states: Secondary | ICD-10-CM

## 2014-03-03 NOTE — Patient Instructions (Signed)
Menopause and Herbal Products  - TRY ESTROVEN.  Menopause is the normal time of life when menstrual periods stop completely. Menopause is complete when you have missed 12 consecutive menstrual periods. It usually occurs between the ages of 47 to 30, with an average age of 47. Very rarely does a woman develop menopause before 47 years old. At menopause, your ovaries stop producing the female hormones, estrogen and progesterone. This can cause undesirable symptoms and also affect your health. Sometimes the symptoms can occur 4 to 5 years before the menopause begins. There is no relationship between menopause and:  Oral contraceptives.  Number of children you had.  Race.  The age your menstrual periods started (menarche). Heavy smokers and very thin women may develop menopause earlier in life. Estrogen and progesterone hormone treatment is the usual method of treating menopausal symptoms. However, there are women who should not take hormone treatment. This is true of:   Women that have breast or uterine cancer.  Women who prefer not to take hormones because of certain side effects (abnormal uterine bleeding).  Women who are afraid that hormones may cause breast cancer.  Women who have a history of liver disease, heart disease, stroke, or blood clots. For these women, there are other medications that may help treat their menopausal symptoms. These medications are found in plants and botanical products. They can be found in the form of herbs, teas, oils, tinctures, and pills.  CAUSES:  The ovaries stop producing the female hormones estrogen and progesterone.  Other causes include:  Surgery to remove both ovaries.  The ovaries stop functioning for no know reason.  Tumors of the pituitary gland in the brain.  Medical disease that affects the ovaries and hormone production.  Radiation treatment to the abdomen or pelvis.  Chemotherapy that affects the  ovaries. PHYTOESTROGENS: Phytoestrogens occur naturally in plants and plant products. They act like estrogen in the body. Herbal medications are made from these plants and botanical steroids. There are 3 types of phytoestrogens:  Isoflavones (genistein and daidzein) are found in soy, garbanzo beans, miso and tofu foods.  Ligins are found in the shell of seeds. They are used to make oils like flaxseed oil. The bacteria in your intestine act on these foods to produce the estrogen-like hormones.  Coumestans are estrogen-like. Some of the foods they are found in include sunflower seeds and bean sprouts. CONDITIONS AND THEIR POSSIBLE HERBAL TREATMENT:  Hot flashes and night sweats.  Soy, black cohosh and evening primrose.  Irritability, insomnia, depression and memory problems.  Chasteberry, ginseng, and soy.  St. John's wort may be helpful for depression. However, there is a concern of it causing cataracts of the eye and may have bad effects on other medications. St. John's wort should not be taken for long time and without your caregiver's advice.  Loss of libido and vaginal and skin dryness.  Wild yam and soy.  Prevention of coronary heart disease and osteoporosis.  Soy and Isoflavones. Several studies have shown that some women benefit from herbal medications, but most of the studies have not consistently shown that these supplements are much better than placebo. Other forms of treatment to help women with menopausal symptoms include a balanced diet, rest, exercise, vitamin and calcium (with vitamin D) supplements, acupuncture, and group therapy when necessary. THOSE WHO SHOULD NOT TAKE HERBAL MEDICATIONS INCLUDE:  Women who are planning on getting pregnant unless told by your caregiver.  Women who are breastfeeding unless told by your caregiver.  Women who are taking other prescription medications unless told by your caregiver.  Infants, children, and elderly women unless told by  your caregiver. Different herbal medications have different and unmeasured amounts of the herbal ingredients. There are no regulations, quality control, and standardization of the ingredients in herbal medications. Therefore, the amount of the ingredient in the medication may vary from one herb, pill, tea, oil or tincture to another. Many herbal medications can cause serious problems and can even have poisonous effects if taken too much or too long. If problems develop, the medication should be stopped and recorded by your caregiver. HOME CARE INSTRUCTIONS  Do not take or give children herbal medications without your caregiver's advice.  Let your caregiver know all the medications you are taking. This includes prescription, over-the-counter, eye drops, and creams.  Do not take herbal medications longer or more than recommended.  Tell your caregiver about any side effects from the medication. SEEK MEDICAL CARE IF:  You develop a fever of 102 F (38.9 C), or as directed by your caregiver.  You feel sick to your stomach (nauseous), vomit, or have diarrhea.  You develop a rash.  You develop abdominal pain.  You develop severe headaches.  You start to have vision problems.  You feel dizzy or faint.  You start to feel numbness in any part of your body.  You start shaking (have convulsions). Document Released: 03/21/2008 Document Revised: 09/19/2012 Document Reviewed: 10/19/2010 [facility] Patient Information 2014 [place].

## 2014-03-03 NOTE — Progress Notes (Signed)
Patient ID: Jenny Bailey, female   DOB: May 06, 1966, 48 y.o.   MRN: [id number]  Subjective  Here for post op check.  Status post robotic total laparoscopic hysterectomy with bilateral salpingectomy on 02/24/14. Feeling really good.  Having to try to limit her activities and remember that she had surgery.   Doing better with Ultram and Ibuprofen.  Had nausea and vomiting with Morphine and then Percocet.  Did better with IV Dilaudid.   No bowel movement yet.  Using Colace and Miralax.   Voiding well. No vaginal bleeding.  Had first hot flash in the waiting room now.  Small endometriosis.  Has June 8th road trip.   Objective  General - NAD.   Abdomen - incisions intact with Dermabond and subQ closure. Ecchymoses noted.  Pathology - benign fibroid, right ovarian serous cyst, left ovarian luteal and follicular cysts, benign cervix, benign endometrium.   Assessment  Doing well post op, Small amount of endometriosis of the left uterosacral ligament.  Plan  Continue decreased activity.  Post op recheck at about 6 weeks. Will initiate estrogen therapy at that time.  OK for Estroven in the meantime.   20 minutes face to face time of which over 50% was spent in counseling.   After visit summary to the patient.

## 2014-04-21 NOTE — Telephone Encounter (Signed)
All procedures were scheduled and has had planned surgery.  Routing to provider for final review. Patient agreeable to disposition. Will close encounter

## 2014-04-23 ENCOUNTER — Ambulatory Visit (INDEPENDENT_AMBULATORY_CARE_PROVIDER_SITE_OTHER): Payer: BC Managed Care – PPO | Admitting: Obstetrics and Gynecology

## 2014-04-23 ENCOUNTER — Encounter: Payer: Self-pay | Admitting: Obstetrics and Gynecology

## 2014-04-23 DIAGNOSIS — E8940 Asymptomatic postprocedural ovarian failure: Secondary | ICD-10-CM

## 2014-04-23 DIAGNOSIS — E8941 Symptomatic postprocedural ovarian failure: Secondary | ICD-10-CM

## 2014-04-23 DIAGNOSIS — N809 Endometriosis, unspecified: Secondary | ICD-10-CM

## 2014-04-23 NOTE — Patient Instructions (Addendum)
Keep your appointment for your annual exam in August with Edman Circle.   You may return to all normal activities.

## 2014-04-23 NOTE — Progress Notes (Addendum)
GYNECOLOGY  VISIT   HPI: 48 y.o.   Single  Caucasian  female   G0P0 with Patient's last menstrual period was 02/10/2014.   here for   Post-Op  Da Vinci robotic total laparoscopic hysterectomy with  bilateral salpingo-oophorectomy and cystoscopy 02/25/14. Had left uterosacral endometriosis.   Feels amazing.  Good energy.  No hot flashes or night sweats.  No change in arthritic symptoms.  Waking up twice a night, not due to hot flashes.   Here to also discuss starting ERT.  Prefers not to take hormonal therapy.   Really happy with her care and is referring others to the practice.  GYNECOLOGIC HISTORY: Patient's last menstrual period was 02/10/2014. Contraception:  Post - Hysterectomy   Menopausal hormone therapy: N/A        OB History   Grav Para Term Preterm Abortions TAB SAB Ect Mult Living   0                  Patient Active Problem List   Diagnosis Date Noted  . Status post laparoscopic hysterectomy 02/25/2014    Past Medical History  Diagnosis Date  . Depression   . Seasonal allergies   . Anxiety   . RA (rheumatoid arthritis) 2013    hands, elbows, knees    Past Surgical History  Procedure Laterality Date  . Wisdom tooth extraction  2007  . Robotic assisted total hysterectomy Bilateral 02/25/2014    Procedure: ROBOTIC ASSISTED TOTAL HYSTERECTOMY WITH BILATERAL SALPINGOOPHARECTOMY;  Surgeon: Jamey Reas de Berton Lan, MD;  Location: [facility];  Service: Gynecology;  Laterality: Bilateral;  . Cystoscopy N/A 02/25/2014    Procedure: CYSTOSCOPY;  Surgeon: Jamey Reas de Berton Lan, MD;  Location: [facility];  Service: Gynecology;  Laterality: N/A;    Current Outpatient Prescriptions  Medication Sig Dispense Refill  . busPIRone (BUSPAR) 15 MG tablet Take 15 mg by mouth.      . cholecalciferol (VITAMIN D) 1000 UNITS tablet Take 1,000 Units by mouth daily.      Marland Kitchen etanercept (ENBREL) 50 MG/ML injection Inject 50 mg into the skin once a week. Every  Thursday      . fluticasone (FLONASE) 50 MCG/ACT nasal spray Place 1 spray into both nostrils daily.      . folic acid (FOLVITE) 1 MG tablet Take 1 mg by mouth.      . hydroxychloroquine (PLAQUENIL) 200 MG tablet Take 400 mg by mouth daily.       Marland Kitchen lamoTRIgine (LAMICTAL) 150 MG tablet Take 150 mg by mouth daily.      Marland Kitchen LORazepam (ATIVAN) 0.5 MG tablet Take 0.5 mg by mouth as needed.      . methotrexate (RHEUMATREX) 2.5 MG tablet Take 25 mg by mouth once a week. Every Sunday.  Takes 10 of the 2.5 mg tablets to = 25 mg dose      . Multiple Vitamin (MULTIVITAMIN) tablet Take 1 tablet by mouth daily.      . sertraline (ZOLOFT) 50 MG tablet Take 150 mg by mouth daily. 3 tabs daily      . ibuprofen (ADVIL,MOTRIN) 600 MG tablet Take 1 tablet (600 mg total) by mouth every 6 (six) hours as needed (mild pain).  30 tablet  0  . oxyCODONE-acetaminophen (PERCOCET/ROXICET) 5-325 MG per tablet Take 1-2 tablets by mouth every 4 (four) hours as needed for severe pain (moderate to severe pain (when tolerating fluids)).  30 tablet  0  . traMADol (ULTRAM)  50 MG tablet Take 1 tablet (50 mg total) by mouth every 6 (six) hours as needed.  30 tablet  0   No current facility-administered medications for this visit.     ALLERGIES: Review of patient's allergies indicates no known allergies.  Family History  Problem Relation Age of Onset  . Thyroid disease Mother     hypothyroid   . Heart failure Father   . Dementia Father   . Stroke Father   . Hypertension Father   . Psoriasis Father   . Lupus Maternal Grandmother   . Breast cancer Maternal Grandmother 60    History   Social History  . Marital Status: Single    Spouse Name: N/A    Number of Children: N/A  . Years of Education: N/A   Occupational History  . Not on file.   Social History Main Topics  . Smoking status: Never Smoker   . Smokeless tobacco: Never Used  . Alcohol Use: No  . Drug Use: No  . Sexual Activity: Yes    Partners: Female     Patent examiner Protection: None   Other Topics Concern  . Not on file   Social History Narrative  . No narrative on file    ROS:  Pertinent items are noted in HPI.  PHYSICAL EXAMINATION:    LMP 02/10/2014     General appearance: alert, cooperative and appears stated age Abdomen: Incisions intact, soft, non-tender; no masses,  no organomegaly  Pelvic: External genitalia:  no lesions              Urethra:  normal appearing urethra with no masses, tenderness or lesions              Bartholins and Skenes: normal                 Vagina: normal appearing vagina with normal color and discharge, no lesions.  Cuff intact.  No suture noted.  Bled very minimally when speculum opened at cuff.  No dehiscence.               Cervix:  absent                   Bimanual Exam:  Uterus:  absent                                      Adnexa:  No masses.                                     ASSESSMENT Status post Da Vinci robotic total laparoscopic hysterectomy with  bilateral salpingo-oophorectomy and cystoscopy 02/25/14.  Doing well post op.   Declines ERT.   PLAN  Return to all normal activities.  I discussed potential benefits and risks of not taking the ERT. Return for annual exam in August with Edman Circle.  Reassess surgical menopause at that time.    An After Visit Summary was printed and given to the patient.  _15_____ minutes face to face time of which over 50% was spent in counseling.

## 2014-06-02 ENCOUNTER — Ambulatory Visit (INDEPENDENT_AMBULATORY_CARE_PROVIDER_SITE_OTHER): Payer: BC Managed Care – PPO | Admitting: Nurse Practitioner

## 2014-06-02 ENCOUNTER — Encounter: Payer: Self-pay | Admitting: Nurse Practitioner

## 2014-06-02 VITALS — BP 108/70 | HR 68 | Ht 71.0 in | Wt 232.0 lb

## 2014-06-02 DIAGNOSIS — Z01419 Encounter for gynecological examination (general) (routine) without abnormal findings: Secondary | ICD-10-CM

## 2014-06-02 NOTE — Patient Instructions (Signed)

## 2014-06-02 NOTE — Progress Notes (Signed)
48 y.o. G0P0 Single Caucasian Fe here for annual exam.  Menorrhagia was evaluated last fall and had endo polyps and had TAH/BSO 02/25/14.  She now feels so much better.  Having no vaso symptoms. Now legally married to her female partner.  Patient's last menstrual period was 02/10/2014.          Sexually active: Yes.    The current method of family planning is status post hysterectomy.    Exercising: Yes.    Home exercise routine includes walking 30-40 mins a day. Smoker:  no  Health Maintenance: Pap:  01/2012 normal with neg HR HPV at PCP per pt MMG:  02/04/14 BI-Rads benign Colonoscopy:  never BMD:   [facility] TDa[facility]:  2007 Labs: PCP maintains all labs   UA: unable to void   reports that she has never smoked. She has never used smokeless tobacco. She reports that she does not drink alcohol or use illicit drugs.  Past Medical History  Diagnosis Date  . Depression   . Seasonal allergies   . Anxiety   . RA (rheumatoid arthritis) 2013    hands, elbows, knees    Past Surgical History  Procedure Laterality Date  . Wisdom tooth extraction  2007  . Robotic assisted total hysterectomy Bilateral 02/25/2014    Procedure: ROBOTIC ASSISTED TOTAL HYSTERECTOMY WITH BILATERAL SALPINGOOPHARECTOMY;  Surgeon: Jamey Reas de Berton Lan, MD;  Location: [facility];  Service: Gynecology;  Laterality: Bilateral;  . Cystoscopy N/A 02/25/2014    Procedure: CYSTOSCOPY;  Surgeon: Jamey Reas de Berton Lan, MD;  Location: [facility];  Service: Gynecology;  Laterality: N/A;    Current Outpatient Prescriptions  Medication Sig Dispense Refill  . busPIRone (BUSPAR) 15 MG tablet Take 15 mg by mouth.      . cholecalciferol (VITAMIN D) 1000 UNITS tablet Take 1,000 Units by mouth daily.      Marland Kitchen etanercept (ENBREL) 50 MG/ML injection Inject 50 mg into the skin once a week. Every Thursday      . fluticasone (FLONASE) 50 MCG/ACT nasal spray Place 1 spray into both nostrils daily.      . folic  acid (FOLVITE) 1 MG tablet Take 1 mg by mouth.      . hydroxychloroquine (PLAQUENIL) 200 MG tablet Take 400 mg by mouth daily.       Marland Kitchen ibuprofen (ADVIL,MOTRIN) 600 MG tablet Take 1 tablet (600 mg total) by mouth every 6 (six) hours as needed (mild pain).  30 tablet  0  . lamoTRIgine (LAMICTAL) 150 MG tablet Take 150 mg by mouth daily.      Marland Kitchen LORazepam (ATIVAN) 0.5 MG tablet Take 0.5 mg by mouth as needed.      . methotrexate (RHEUMATREX) 2.5 MG tablet Take 25 mg by mouth once a week. Every Sunday.  Takes 10 of the 2.5 mg tablets to = 25 mg dose      . Multiple Vitamin (MULTIVITAMIN) tablet Take 1 tablet by mouth daily.      . sertraline (ZOLOFT) 50 MG tablet Take 150 mg by mouth daily. 3 tabs daily       No current facility-administered medications for this visit.    Family History  Problem Relation Age of Onset  . Thyroid disease Mother     hypothyroid   . Heart failure Father   . Dementia Father   . Stroke Father   . Hypertension Father   . Psoriasis Father   . Lupus Maternal Grandmother   .  Breast cancer Maternal Grandmother 60    ROS:  Pertinent items are noted in HPI.  Otherwise, a comprehensive ROS was negative.  Exam:   BP 108/70  Pulse 68  Ht 5\' 11"  (1.803 m)  Wt 232 lb (105.235 kg)  BMI 32.37 kg/m2  LMP 02/10/2014 Height: 5\' 11"  (180.3 cm)  Ht Readings from Last 3 Encounters:  06/02/14 5\' 11"  (1.803 m)  02/25/14 6' (1.829 m)  02/25/14 6' (1.829 m)    General appearance: alert, cooperative and appears stated age Head: Normocephalic, without obvious abnormality, atraumatic Neck: no adenopathy, supple, symmetrical, trachea midline and thyroid normal to inspection and palpation Lungs: clear to auscultation bilaterally Breasts: normal appearance, no masses or tenderness Heart: regular rate and rhythm Abdomen: soft, non-tender; no masses,  no organomegaly Extremities: extremities normal, atraumatic, no cyanosis or edema Skin: Skin color, texture, turgor normal. No  rashes or lesions Lymph nodes: Cervical, supraclavicular, and axillary nodes normal. No abnormal inguinal nodes palpated Neurologic: Grossly normal   Pelvic: External genitalia:  no lesions              Urethra:  normal appearing urethra with no masses, tenderness or lesions              Bartholin's and Skene's: normal                 Vagina: normal appearing vagina with normal color and discharge, no lesions              Cervix: absent              Pap taken: No. Bimanual Exam:  Uterus:  uterus absent              Adnexa: no mass, fullness, tenderness               Rectovaginal: Confirms               Anus:  normal sphincter tone, no lesions  A:  Well Woman with normal exam  S/P TAH / BSO 02/2014 secondary to AUB    P:   Reviewed health and wellness pertinent to exam  Pap smear not taken today  Mammogram is due 4/16  Counseled on breast self exam, mammography screening, adequate intake of calcium and vitamin D, diet and exercise return annually or prn  An After Visit Summary was printed and given to the patient.  Patient is very appreciative of the kindness with her surgery.

## 2014-06-08 NOTE — Progress Notes (Signed)
Encounter reviewed by Dr. Brook Silva.  

## 2014-08-28 ENCOUNTER — Encounter: Payer: Self-pay | Admitting: Obstetrics and Gynecology

## 2014-08-28 ENCOUNTER — Ambulatory Visit (INDEPENDENT_AMBULATORY_CARE_PROVIDER_SITE_OTHER): Payer: BC Managed Care – PPO | Admitting: Obstetrics and Gynecology

## 2014-08-28 VITALS — BP 110/80 | HR 80 | Ht 71.25 in | Wt 231.6 lb

## 2014-08-28 DIAGNOSIS — N951 Menopausal and female climacteric states: Secondary | ICD-10-CM

## 2014-08-28 MED ORDER — ESTRADIOL 0.1 MG/24HR TD PTTW: 1.0000 | MEDICATED_PATCH | TRANSDERMAL | Status: DC

## 2014-08-28 NOTE — Progress Notes (Signed)
Patient ID: Jenny Bailey, female   DOB: 06-16-1966, 48 y.o.   MRN: [id number] GYNECOLOGY  VISIT   HPI: 48 y.o.   Married  Caucasian  female   G0P0 with Patient's last menstrual period was 02/10/2014.   here for consult regard HRT.  Patient complaining of hot flashes and mood swings.   Hot flashes, aphagia, and snappy.  Added Meloxican to regimen for arthritis.  On Zoloft, Buspar, Lamictal.   GYNECOLOGIC HISTORY: Patient's last menstrual period was 02/10/2014. Contraception: same sex partner (TAH/BSO)   Menopausal hormone therapy: n/a Mammogram 02/04/14 - normal OB History    Gravida Para Term Preterm AB TAB SAB Ectopic Multiple Living   0                  There are no active problems to display for this patient.   Past Medical History  Diagnosis Date  . Depression   . Seasonal allergies   . Anxiety   . RA (rheumatoid arthritis) 2013    hands, elbows, knees    Past Surgical History  Procedure Laterality Date  . Wisdom tooth extraction  2007  . Robotic assisted total hysterectomy Bilateral 02/25/2014    Procedure: ROBOTIC ASSISTED TOTAL HYSTERECTOMY WITH BILATERAL SALPINGOOPHARECTOMY;  Surgeon: Jamey Reas de Berton Lan, MD;  Location: [facility];  Service: Gynecology;  Laterality: Bilateral;  . Cystoscopy N/A 02/25/2014    Procedure: CYSTOSCOPY;  Surgeon: Jamey Reas de Berton Lan, MD;  Location: [facility];  Service: Gynecology;  Laterality: N/A;    Current Outpatient Prescriptions  Medication Sig Dispense Refill  . busPIRone (BUSPAR) 15 MG tablet Take 15 mg by mouth.    . cholecalciferol (VITAMIN D) 1000 UNITS tablet Take 1,000 Units by mouth daily.    Marland Kitchen etanercept (ENBREL) 50 MG/ML injection Inject 50 mg into the skin once a week. Every Thursday    . fluticasone (FLONASE) 50 MCG/ACT nasal spray Place 1 spray into both nostrils daily.    . folic acid (FOLVITE) 1 MG tablet Take 1 mg by mouth.    . hydroxychloroquine (PLAQUENIL) 200 MG tablet Take 400 mg by  mouth daily.     Marland Kitchen ibuprofen (ADVIL,MOTRIN) 600 MG tablet Take 1 tablet (600 mg total) by mouth every 6 (six) hours as needed (mild pain). 30 tablet 0  . lamoTRIgine (LAMICTAL) 150 MG tablet Take 150 mg by mouth daily.    Marland Kitchen LORazepam (ATIVAN) 0.5 MG tablet Take 0.5 mg by mouth as needed.    . meloxicam (MOBIC) 15 MG tablet Take 15 mg by mouth daily.    . methotrexate (RHEUMATREX) 2.5 MG tablet Take 25 mg by mouth once a week. Every Sunday.  Takes 10 of the 2.5 mg tablets to = 25 mg dose    . Multiple Vitamin (MULTIVITAMIN) tablet Take 1 tablet by mouth daily.    . sertraline (ZOLOFT) 50 MG tablet Take 150 mg by mouth daily. 3 tabs daily     No current facility-administered medications for this visit.     ALLERGIES: Review of patient's allergies indicates no known allergies.  Family History  Problem Relation Age of Onset  . Thyroid disease Mother     hypothyroid   . Heart failure Father   . Dementia Father   . Stroke Father   . Hypertension Father   . Psoriasis Father   . Lupus Maternal Grandmother   . Breast cancer Maternal Grandmother 60    History   Social History  .  Marital Status: Married    Spouse Name: N/A    Number of Children: N/A  . Years of Education: N/A   Occupational History  . Not on file.   Social History Main Topics  . Smoking status: Never Smoker   . Smokeless tobacco: Never Used  . Alcohol Use: No  . Drug Use: No  . Sexual Activity:    Partners: Female    Patent examiner Protection: Surgical     Comment: TAH/BSO   Other Topics Concern  . Not on file   Social History Narrative    ROS:  Pertinent items are noted in HPI.  PHYSICAL EXAMINATION:    BP 110/80 mmHg  Pulse 80  Ht 5' 11.25" (1.81 m)  Wt 231 lb 9.6 oz (105.053 kg)  BMI 32.07 kg/m2  LMP 02/10/2014     General appearance: alert, cooperative and appears stated age  ASSESSMENT  Menopausal symptoms. On Zoloft.  PLAN  Discussed ERT benefits and risks.  Discussed thrombotic  risks of DVT, PE, MI, stroke, and breast cancer. Discussed benefits of overall cardiovascular and bone protection as she is starting at a newly menopausal age. Minivelle 0.1 mg twice weekly to skin.  8 patches.  RF 3. Follow up in 3 months.    An After Visit Summary was printed and given to the patient.  ___15___ minutes face to face time of which over 50% was spent in counseling.

## 2014-08-28 NOTE — Patient Instructions (Signed)
Estradiol skin patches What is this medicine? ESTRADIOL (es tra DYE ole) skin patches contain an estrogen. It is mostly used as hormone replacement in menopausal women. It helps to treat hot flashes and prevent osteoporosis. It is also used to treat women with low estrogen levels or those who have had their ovaries removed. This medicine may be used for other purposes; ask your health care provider or pharmacist if you have questions. COMMON BRAND NAME(S): Alora, Climara, Esclim, Estraderm, FemPatch, Menostar, Minivelle, Vivelle, Vivelle-Dot What should I tell my health care provider before I take this medicine? They need to know if you have any of these conditions: -abnormal vaginal bleeding -blood vessel disease or blood clots -breast, cervical, endometrial, ovarian, liver, or uterine cancer -dementia -diabetes -gallbladder disease -heart disease or recent heart attack -high blood pressure -high cholesterol -high level of calcium in the blood -hysterectomy -kidney disease -liver disease -migraine headaches -protein C deficiency -protein S deficiency -stroke -systemic lupus erythematosus (SLE) -tobacco smoker -an unusual or allergic reaction to estrogens, other hormones, medicines, foods, dyes, or preservatives -pregnant or trying to get pregnant -breast-feeding How should I use this medicine? This medicine is for external use only. Follow the directions on the prescription label. Tear open the pouch, do not use scissors. Remove the stiff protective liner covering the adhesive. Try not to touch the adhesive. Apply the patch, sticky side to the skin, to an area that is clean, dry and hairless. Avoid injured, irritated, calloused, or scarred areas. Do not apply the skin patches to your breasts or around the waistline. Use a different site each time to prevent skin irritation. Do not cut or trim the patch. Do not stop using except on the advice of your doctor or health care professional.  Do not wear more than one patch at a time unless you are told to do so by your doctor or health care professional. Contact your pediatrician regarding the use of this medicine in children. Special care may be needed. A patient package insert for the product will be given with each prescription and refill. Read this sheet carefully each time. The sheet may change frequently. Overdosage: If you think you have taken too much of this medicine contact a poison control center or emergency room at once. NOTE: This medicine is only for you. Do not share this medicine with others. What if I miss a dose? If you miss a dose, apply it as soon as you can. If it is almost time for your next dose, apply only that dose. Do not apply double or extra doses. What may interact with this medicine? Do not take this medicine with any of the following medications: -aromatase inhibitors like aminoglutethimide, anastrozole, exemestane, letrozole, testolactone This medicine may also interact with the following medications: -carbamazepine -certain antibiotics used to treat infections -certain barbiturates used for inducing sleep or treating seizures -grapefruit juice -medicines for fungus infections like itraconazole and ketoconazole -raloxifene or tamoxifen -rifabutin, rifampin, or rifapentine -ritonavir -St. John's Wort This list may not describe all possible interactions. Give your health care provider a list of all the medicines, herbs, non-prescription drugs, or dietary supplements you use. Also tell them if you smoke, drink alcohol, or use illegal drugs. Some items may interact with your medicine. What should I watch for while using this medicine? Visit your doctor or health care professional for regular checks on your progress. You will need a regular breast and pelvic exam and Pap smear while on this medicine. You should also   discuss the need for regular mammograms with your health care professional, and follow  his or her guidelines for these tests. This medicine can make your body retain fluid, making your fingers, hands, or ankles swell. Your blood pressure can go up. Contact your doctor or health care professional if you feel you are retaining fluid. If you have any reason to think you are pregnant, stop taking this medicine right away and contact your doctor or health care professional. Smoking increases the risk of getting a blood clot or having a stroke while you are taking this medicine, especially if you are more than 47 years old. You are strongly advised not to smoke. If you wear contact lenses and notice visual changes, or if the lenses begin to feel uncomfortable, consult your eye doctor or health care professional. This medicine can increase the risk of developing a condition (endometrial hyperplasia) that may lead to cancer of the lining of the uterus. Taking progestins, another hormone drug, with this medicine lowers the risk of developing this condition. Therefore, if your uterus has not been removed (by a hysterectomy), your doctor may prescribe a progestin for you to take together with your estrogen. You should know, however, that taking estrogens with progestins may have additional health risks. You should discuss the use of estrogens and progestins with your health care professional to determine the benefits and risks for you. If you are going to have surgery or an MRI, you may need to stop taking this medicine. Consult your health care professional for advice before you schedule the surgery. You may bathe or participate in other activities while wearing your patch. If the patch pulls loose or falls off, you may reapply it if the patch is sticky enough to stay on the skin. You should reapply the patch in a different area. Use a fresh patch if it will no longer stick. What side effects may I notice from receiving this medicine? Side effects that you should report to your doctor or health care  professional as soon as possible: -allergic reactions like skin rash, itching or hives, swelling of the face, lips, or tongue -breast tissue changes or discharge -changes in vision -chest pain -confusion, trouble speaking or understanding -dark urine -general ill feeling or flu-like symptoms -light-colored stools -nausea, vomiting -pain, swelling, warmth in the leg -right upper belly pain -severe headaches -shortness of breath -sudden numbness or weakness of the face, arm or leg -trouble walking, dizziness, loss of balance or coordination -unusual vaginal bleeding -yellowing of the eyes or skin Side effects that usually do not require medical attention (report to your doctor or health care professional if they continue or are bothersome): -hair loss -increased hunger or thirst -increased urination -symptoms of vaginal infection like itching, irritation or unusual discharge -unusually weak or tired This list may not describe all possible side effects. Call your doctor for medical advice about side effects. You may report side effects to FDA at 1-800-FDA-1088. Where should I keep my medicine? Keep out of the reach of children. Store at room temperature below 30 degrees C (86 degrees F). Do not store any patches that have been removed from their protective pouch. Throw away any unused medicine after the expiration date. Dispose of used patches properly. Since used patches may still contain active hormones, fold the patch in half so that it sticks to itself prior to disposal. NOTE: This sheet is a summary. It may not cover all possible information. If you have questions about this medicine,   talk to your doctor, pharmacist, or health care provider.  2015, Elsevier/Gold Standard. (2011-01-05 09:19:41)  

## 2014-11-24 ENCOUNTER — Other Ambulatory Visit: Payer: Self-pay | Admitting: Obstetrics and Gynecology

## 2014-11-24 NOTE — Telephone Encounter (Signed)
Medication refill request: Minivelle 0.1 mg  Last AEX: 06/02/14 with Ms. Patty Next AEX: 06/16/15 with Ms. Patty Last MMG (if hormonal medication request): 02/04/14 Bi-Rads 2: Benign Refill authorized: #8 patches  09/06/14 patient was started on Minivelle Patches, Dr. Quincy Simmonds sent in #8 patches/3 rfs so patient could try and she then was to have a recheck in 3 months. Patient is scheduled for recheck 12/04/14 with Dr. Quincy Simmonds.  Left Message To Call Back to see how patient is doing on it/any side effects and if she needs refill.

## 2014-11-26 NOTE — Telephone Encounter (Signed)
Left Message To Call Back  

## 2014-12-01 NOTE — Telephone Encounter (Addendum)
Patient is scheduled for 3 month recheck 12/04/14 with Dr. Quincy Simmonds will deny since i've called patient x2 and haven't gotten a call back.

## 2014-12-04 ENCOUNTER — Ambulatory Visit (INDEPENDENT_AMBULATORY_CARE_PROVIDER_SITE_OTHER): Payer: BC Managed Care – PPO | Admitting: Obstetrics and Gynecology

## 2014-12-04 ENCOUNTER — Encounter: Payer: Self-pay | Admitting: Obstetrics and Gynecology

## 2014-12-04 VITALS — BP 104/78 | HR 82 | Ht 71.25 in | Wt 233.2 lb

## 2014-12-04 DIAGNOSIS — N951 Menopausal and female climacteric states: Secondary | ICD-10-CM

## 2014-12-04 MED ORDER — ESTRADIOL 0.1 MG/24HR TD PTTW: 1.0000 | MEDICATED_PATCH | TRANSDERMAL | Status: DC

## 2014-12-04 NOTE — Progress Notes (Signed)
GYNECOLOGY  VISIT   HPI: 49 y.o.   Married  Caucasian  female   G0P0 with Patient's last menstrual period was 02/10/2014.   here for follow up of HRT (Minivelle 0.1 mg) Patient complaining of hot flashes and mood swings at prior visit, so ERT was started.  Memory is much improved.  Mood is improved.  No night sweats or hot flashes.  No nausea, fluid retention, bloating, or breast tenderness.    No reactions to the patch.   Switching to Humira for arthritis.   GYNECOLOGIC HISTORY: Patient's last menstrual period was 02/10/2014. Contraception:   TAH/BSO Menopausal hormone therapy: Minivelle 0.1 mg        OB History    Gravida Para Term Preterm AB TAB SAB Ectopic Multiple Living   0                  There are no active problems to display for this patient.   Past Medical History  Diagnosis Date  . Depression   . Seasonal allergies   . Anxiety   . RA (rheumatoid arthritis) 2013    hands, elbows, knees    Past Surgical History  Procedure Laterality Date  . Wisdom tooth extraction  2007  . Robotic assisted total hysterectomy Bilateral 02/25/2014    Procedure: ROBOTIC ASSISTED TOTAL HYSTERECTOMY WITH BILATERAL SALPINGOOPHARECTOMY;  Surgeon: Jamey Reas de Berton Lan, MD;  Location: [facility];  Service: Gynecology;  Laterality: Bilateral;  . Cystoscopy N/A 02/25/2014    Procedure: CYSTOSCOPY;  Surgeon: Jamey Reas de Berton Lan, MD;  Location: [facility];  Service: Gynecology;  Laterality: N/A;    Current Outpatient Prescriptions  Medication Sig Dispense Refill  . busPIRone (BUSPAR) 15 MG tablet Take 15 mg by mouth.    . cholecalciferol (VITAMIN D) 1000 UNITS tablet Take 1,000 Units by mouth daily.    Marland Kitchen estradiol (MINIVELLE) 0.1 MG/24HR patch Place 1 patch (0.1 mg total) onto the skin 2 (two) times a week. Apply anywhere on lower abdomen.  Change patch twice a week. 8 patch 3  . etanercept (ENBREL) 50 MG/ML injection Inject 50 mg into the skin once a week.  Every Thursday    . fluticasone (FLONASE) 50 MCG/ACT nasal spray Place 1 spray into both nostrils daily.    . folic acid (FOLVITE) 1 MG tablet Take 1 mg by mouth.    . hydroxychloroquine (PLAQUENIL) 200 MG tablet Take 400 mg by mouth daily.     Marland Kitchen ibuprofen (ADVIL,MOTRIN) 600 MG tablet Take 1 tablet (600 mg total) by mouth every 6 (six) hours as needed (mild pain). 30 tablet 0  . lamoTRIgine (LAMICTAL) 150 MG tablet Take 150 mg by mouth daily.    Marland Kitchen LORazepam (ATIVAN) 0.5 MG tablet Take 0.5 mg by mouth as needed.    . meloxicam (MOBIC) 15 MG tablet Take 15 mg by mouth daily.    . methotrexate (RHEUMATREX) 2.5 MG tablet Take 25 mg by mouth once a week. Every Sunday.  Takes 10 of the 2.5 mg tablets to = 25 mg dose    . Multiple Vitamin (MULTIVITAMIN) tablet Take 1 tablet by mouth daily.    . sertraline (ZOLOFT) 50 MG tablet Take 150 mg by mouth daily. 3 tabs daily     No current facility-administered medications for this visit.     ALLERGIES: Review of patient's allergies indicates no known allergies.  Family History  Problem Relation Age of Onset  . Thyroid disease Mother  hypothyroid   . Heart failure Father   . Dementia Father   . Stroke Father   . Hypertension Father   . Psoriasis Father   . Lupus Maternal Grandmother   . Breast cancer Maternal Grandmother 60    History   Social History  . Marital Status: Married    Spouse Name: N/A  . Number of Children: N/A  . Years of Education: N/A   Occupational History  . Not on file.   Social History Main Topics  . Smoking status: Never Smoker   . Smokeless tobacco: Never Used  . Alcohol Use: No  . Drug Use: No  . Sexual Activity:    Partners: Female    Patent examiner Protection: Surgical     Comment: TAH/BSO   Other Topics Concern  . Not on file   Social History Narrative    ROS:  Pertinent items are noted in HPI.  PHYSICAL EXAMINATION:    LMP 02/10/2014     General appearance: alert, cooperative and appears  stated age   ASSESSMENT  Menopausal symptoms.  Controlled on Minivelle 0.1 mg twice weekly.   PLAN  Mammogram due in April 2016.  Discussed possible 3D. Refill of Minivelle 0.1 mg twice weekly.  #24, RF 1.  Annual exam with [facility] in August 2016.    An After Visit Summary was printed and given to the patient.  __15____ minutes face to face time of which over 50% was spent in counseling.

## 2015-01-08 ENCOUNTER — Other Ambulatory Visit: Payer: Self-pay | Admitting: Obstetrics and Gynecology

## 2015-01-08 NOTE — Telephone Encounter (Signed)
Left Message To Call Back Re: 12/04/14 #24/1 rfs was sent to [facility]  Does patient need rx sent to [facility] or [facility]?

## 2015-01-12 NOTE — Telephone Encounter (Signed)
Left Message To Call Back  

## 2015-01-12 NOTE — Telephone Encounter (Signed)
Patient called me back she said she would like the remainder of her refills to be sent to [facility]. She said she hasn't received her prescription that was sent to her [place]. Patient said she is one dose behind and would just like her refills to be sent to [facility] instead.  Okay to send in refills to [facility] instead to last patient until AEX 06/16/15?  Medication refill request: Minivelle 0.1 Last AEX:  06/02/14 with PG Next AEX: 06/16/15 with PG  Last MMG (if hormonal medication request): 02/04/14 Bi-Rads 2: Benign Refill authorized: #24/1 rfs?

## 2015-01-13 NOTE — Telephone Encounter (Signed)
Patient notified that rx has been sent. 

## 2015-05-04 ENCOUNTER — Other Ambulatory Visit: Payer: Self-pay | Admitting: Obstetrics & Gynecology

## 2015-05-04 NOTE — Telephone Encounter (Signed)
She may have only 1 month of her HRT - she is way past due for Mammogram and may not have another refill until this is done per our protocol.

## 2015-05-04 NOTE — Telephone Encounter (Signed)
Medication refill request: Vivelle Dot  Last AEX:  05/30/13 with PG Next AEX: 06/16/15 with PG Last MMG (if hormonal medication request): 02/04/14 Bi-rads C 2 Benign Refill authorized: Please advise.

## 2015-05-04 NOTE — Telephone Encounter (Signed)
LMTCB regarding  HRT medication refill and mammogram that is due.

## 2015-05-07 ENCOUNTER — Telehealth: Payer: Self-pay | Admitting: Nurse Practitioner

## 2015-05-07 NOTE — Telephone Encounter (Signed)
Patient sent a message through [facility] asking to change her HRT medication dosage. Patient has an aex 06/16/15 with PG and asked to possibly move to an earlier appointment in July or early August. FYI: Patient was last seen 06/02/14 for aex, she cannot be scheduled before 06/03/15 due to insurance.

## 2015-05-07 NOTE — Telephone Encounter (Signed)
Call to patient, left message to call back. Ask for triage.

## 2015-05-08 NOTE — Telephone Encounter (Signed)
Pt was given a call again regarding her HRT medication. Pt's medication was sent in for a 30 day supply and that pt is due for a mammogram.

## 2015-05-18 NOTE — Telephone Encounter (Signed)
Follow-up call to patient. Left message to call back. Ask for triage nurse.

## 2015-05-27 NOTE — Telephone Encounter (Signed)
Message left to return call to Meghan Warshawsky at [phone].    

## 2015-06-01 NOTE — Telephone Encounter (Signed)
Jenny Bailey,  Attempted to reach patient x 3. No return call.  Patient has annual exam 06/16/15 scheduled.  Okay to close encounter?

## 2015-06-01 NOTE — Telephone Encounter (Signed)
Yes OK to close and will discuss HRT at AEX.

## 2015-06-16 ENCOUNTER — Ambulatory Visit (INDEPENDENT_AMBULATORY_CARE_PROVIDER_SITE_OTHER): Payer: BC Managed Care – PPO | Admitting: Nurse Practitioner

## 2015-06-16 ENCOUNTER — Encounter: Payer: Self-pay | Admitting: Nurse Practitioner

## 2015-06-16 VITALS — BP 114/68 | HR 80 | Ht 71.0 in | Wt 237.0 lb

## 2015-06-16 DIAGNOSIS — Z0000 Encounter for general adult medical examination without abnormal findings: Secondary | ICD-10-CM

## 2015-06-16 DIAGNOSIS — Z01419 Encounter for gynecological examination (general) (routine) without abnormal findings: Secondary | ICD-10-CM

## 2015-06-16 DIAGNOSIS — F32A Depression, unspecified: Secondary | ICD-10-CM | POA: Insufficient documentation

## 2015-06-16 DIAGNOSIS — F329 Major depressive disorder, single episode, unspecified: Secondary | ICD-10-CM | POA: Insufficient documentation

## 2015-06-16 DIAGNOSIS — Z79890 Hormone replacement therapy (postmenopausal): Secondary | ICD-10-CM | POA: Diagnosis not present

## 2015-06-16 DIAGNOSIS — M069 Rheumatoid arthritis, unspecified: Secondary | ICD-10-CM | POA: Diagnosis not present

## 2015-06-16 DIAGNOSIS — F418 Other specified anxiety disorders: Secondary | ICD-10-CM | POA: Diagnosis not present

## 2015-06-16 DIAGNOSIS — F419 Anxiety disorder, unspecified: Secondary | ICD-10-CM

## 2015-06-16 MED ORDER — ESTRADIOL 0.1 MG/24HR TD PTTW: MEDICATED_PATCH | TRANSDERMAL | Status: DC

## 2015-06-16 NOTE — Progress Notes (Signed)
Patient ID: Jenny Bailey, female   DOB: [birth year], 49 y.o.   MRN: [id number] 49 y.o. G0 Married  Caucasian Fe here for annual exam.  No new health problems.  Patient's last menstrual period was 02/10/2014 (exact date).          Sexually active: Yes.    The current method of family planning is status post hysterectomy.    Exercising: Yes.    Gym/ health club routine includes water aerobics and walking "a few times a week". Smoker:  no  Health Maintenance: Pap: 01/2012 with PCP, normal per patient, hysterectomy 02/2014 neg path MMG: 05/21/15, Bi-Rads 1:  Negative  [facility] was low normal TDaP: 2007  Labs: PCP takes care of all labs   reports that she has never smoked. She has never used smokeless tobacco. She reports that she does not drink alcohol or use illicit drugs.  Past Medical History  Diagnosis Date  . Depression   . Seasonal allergies   . Anxiety   . RA (rheumatoid arthritis) 2013    hands, elbows, knees    Past Surgical History  Procedure Laterality Date  . Wisdom tooth extraction  2007  . Robotic assisted total hysterectomy Bilateral 02/25/2014    Procedure: ROBOTIC ASSISTED TOTAL HYSTERECTOMY WITH BILATERAL SALPINGOOPHARECTOMY;  Surgeon: Jamey Reas de Berton Lan, MD;  Location: [facility];  Service: Gynecology;  Laterality: Bilateral;  . Cystoscopy N/A 02/25/2014    Procedure: CYSTOSCOPY;  Surgeon: Jamey Reas de Berton Lan, MD;  Location: [facility];  Service: Gynecology;  Laterality: N/A;    Current Outpatient Prescriptions  Medication Sig Dispense Refill  . busPIRone (BUSPAR) 15 MG tablet Take 15 mg by mouth.    . cholecalciferol (VITAMIN D) 1000 UNITS tablet Take 1,000 Units by mouth daily.    Scarlette Shorts SURECLICK 50 MG/ML injection     . estradiol (MINIVELLE) 0.1 MG/24HR patch Place 1 patch (0.1 mg total) onto the skin 2 (two) times a week. Apply anywhere on lower abdomen.  Change patch twice a week. 24 patch 1  .  estradiol (VIVELLE-DOT) 0.1 MG/24HR patch PLACE 1 PATCH (0.1 MG TOTAL) ONTO THE SKIN 2 (TWO) TIMES A WEEK.  APPLY ANYWHERE ON LOWER ABDOMEN.  CHANGE PATCH TWICE A WEEK. 8 patch 0  . fluticasone (FLONASE) 50 MCG/ACT nasal spray Place 1 spray into both nostrils daily.    . folic acid (FOLVITE) 1 MG tablet Take 1 mg by mouth.    . hydroxychloroquine (PLAQUENIL) 200 MG tablet Take 400 mg by mouth daily.     Marland Kitchen ibuprofen (ADVIL,MOTRIN) 600 MG tablet Take 1 tablet (600 mg total) by mouth every 6 (six) hours as needed (mild pain). 30 tablet 0  . lamoTRIgine (LAMICTAL) 150 MG tablet Take 1 tablet by mouth daily.    Marland Kitchen LORazepam (ATIVAN) 0.5 MG tablet Take 0.5 mg by mouth as needed.    . meloxicam (MOBIC) 15 MG tablet Take 15 mg by mouth daily.    . methotrexate (RHEUMATREX) 2.5 MG tablet Take 25 mg by mouth once a week. Every Sunday.  Takes 10 of the 2.5 mg tablets to = 25 mg dose    . Multiple Vitamin (MULTIVITAMIN) tablet Take 1 tablet by mouth daily.    . sertraline (ZOLOFT) 50 MG tablet Take 150 mg by mouth daily. 3 tabs daily     No current facility-administered medications for this visit.    Family History  Problem Relation Age  of Onset  . Thyroid disease Mother     hypothyroid   . Heart failure Father   . Dementia Father   . Stroke Father   . Hypertension Father   . Psoriasis Father   . Lupus Maternal Grandmother   . Breast cancer Maternal Grandmother 60    ROS:  Pertinent items are noted in HPI.  Otherwise, a comprehensive ROS was negative.  Exam:   BP 114/68 mmHg  Pulse 80  Ht 5\' 11"  (1.803 m)  Wt 237 lb (107.502 kg)  BMI 33.07 kg/m2  LMP 02/10/2014 (Exact Date) Height: 5\' 11"  (180.3 cm) Ht Readings from Last 3 Encounters:  06/16/15 5\' 11"  (1.803 m)  12/04/14 5' 11.25" (1.81 m)  08/28/14 5' 11.25" (1.81 m)    General appearance: alert, cooperative and appears stated age Head: Normocephalic, without obvious abnormality, atraumatic Neck: no adenopathy, supple, symmetrical,  trachea midline and thyroid normal to inspection and palpation Lungs: clear to auscultation bilaterally Breasts: normal appearance, no masses or tenderness Heart: regular rate and rhythm Abdomen: soft, non-tender; no masses,  no organomegaly Extremities: extremities normal, atraumatic, no cyanosis or edema Skin: Skin color, texture, turgor normal. No rashes or lesions Lymph nodes: Cervical, supraclavicular, and axillary nodes normal. No abnormal inguinal nodes palpated Neurologic: Grossly normal   Pelvic: External genitalia:  no lesions              Urethra:  normal appearing urethra with no masses, tenderness or lesions              Bartholin's and Skene's: normal                 Vagina: normal appearing vagina with normal color and discharge, no lesions              Cervix: absent              Pap taken: No. Bimanual Exam:  Uterus:  uterus absent              Adnexa: no mass, fullness, tenderness               Rectovaginal: Confirms               Anus:  normal sphincter tone, no lesions  Chaperone present: no  A:  Well Woman with normal exam  S/P TAH / BSO 02/2014 secondary to AUB  RA since 2013  History of anxiety and depression  P:   Reviewed health and wellness pertinent to exam  Pap smear as above  Mammogram is due 05/2016  Refill on Vivelle dot 0.1 mg patch twice a week for a year  Counseled on breast self exam, mammography screening, use and side effects of HRT, adequate intake of calcium and vitamin D, diet and exercise, Kegel's exercises return annually or prn  An After Visit Summary was printed and given to the patient.

## 2015-06-16 NOTE — Patient Instructions (Signed)

## 2015-06-17 NOTE — Progress Notes (Signed)
Encounter reviewed by Dr. Brook Amundson C. Silva.  

## 2016-03-31 ENCOUNTER — Telehealth: Payer: Self-pay | Admitting: Nurse Practitioner

## 2016-03-31 NOTE — Telephone Encounter (Signed)
Patient is asking to change her Estradiol patch to a pill form due to cost . [facility], [place]. [phone]

## 2016-03-31 NOTE — Telephone Encounter (Signed)
Patient is currently on Vivelle dot .1 mg patch twice weekly. She was last seen on 06/16/2015 for her aex. Last mammogram was performed on 05/21/2015 which was normal. Birads 1 Negative.  Kem Boroughs, FNP is it okay to switch patient to oral estrogen at this time? If so please advise dosage.

## 2016-04-01 NOTE — Telephone Encounter (Signed)
Kem Boroughs, FNP per my review I do not see that Estradiol tablets come in 0.1 mg dosage. Please review.

## 2016-04-01 NOTE — Telephone Encounter (Signed)
Yes OK to change to Estradiol 0.1 mg po daily until next AEX.

## 2016-04-04 MED ORDER — ESTRADIOL 1 MG PO TABS: 1.0000 mg | ORAL_TABLET | Freq: Every day | ORAL | Status: DC

## 2016-04-04 NOTE — Telephone Encounter (Signed)
Left detailed message at number provided, [phone], okay per ROI. Advised rx for oral Estradiol 1 mg take 1 tablet po daily #90 0RF has been sent to her pharmacy on file as an alternative to her Vivelle dot. Advised to return call with further questions or concerns.  Routing to provider for final review. Patient agreeable to disposition. Will close encounter.

## 2016-04-04 NOTE — Telephone Encounter (Signed)
Correction on the dose:  Should be 1.0 mg daily of Estradiol

## 2016-06-22 ENCOUNTER — Ambulatory Visit (INDEPENDENT_AMBULATORY_CARE_PROVIDER_SITE_OTHER): Payer: BC Managed Care – PPO | Admitting: Nurse Practitioner

## 2016-06-22 ENCOUNTER — Encounter: Payer: Self-pay | Admitting: Nurse Practitioner

## 2016-06-22 VITALS — BP 116/74 | HR 76 | Ht 71.0 in | Wt 237.0 lb

## 2016-06-22 DIAGNOSIS — Z01419 Encounter for gynecological examination (general) (routine) without abnormal findings: Secondary | ICD-10-CM

## 2016-06-22 DIAGNOSIS — Z23 Encounter for immunization: Secondary | ICD-10-CM

## 2016-06-22 DIAGNOSIS — Z0000 Encounter for general adult medical examination without abnormal findings: Secondary | ICD-10-CM

## 2016-06-22 DIAGNOSIS — Z79890 Hormone replacement therapy (postmenopausal): Secondary | ICD-10-CM

## 2016-06-22 MED ORDER — ESTRADIOL 1 MG PO TABS: 1.0000 mg | ORAL_TABLET | Freq: Every day | ORAL | 0 refills | Status: DC

## 2016-06-22 NOTE — Progress Notes (Signed)
Patient ID: Jenny Bailey, female   DOB: January 01, 1966, 50 y.o.   MRN: [id number]  50 y.o. G0P0000 Married  Caucasian Fe here for annual exam.  On ERT since 02/2014 and doing well.  No vaso symptoms.  No vaginal dryness.  Breast cancer diagnosis with mother age 35.  Let lumpectomy and margins were not clear.  Mother declines further treatment.  She has not had BRCA testing.   Patient's last menstrual period was 02/10/2014 (exact date).          Sexually active: Yes.    The current method of family planning is status post hysterectomy.    Exercising: Yes.    Home exercise routine includes walking 3 times per week. Smoker:  no  Health Maintenance: Pap: 01/2012 with PCP, normal per patient, hysterectomy 02/2014 neg path MMG: 05/21/15, Bi-Rads 1:  Negative; [facility] (not scheduled per Ilia-2017) [facility]: [facility] scan was low normal TDaP:Update today HIV: 1994 Labs: PCP takes care of all labs    reports that she has never smoked. She has never used smokeless tobacco. She reports that she does not drink alcohol or use drugs.  Past Medical History:  Diagnosis Date  . Anxiety 1999  . Depression 1999  . RA (rheumatoid arthritis) ([place]) 2013   hands, elbows, knees  . Seasonal allergies     Past Surgical History:  Procedure Laterality Date  . CYSTOSCOPY N/A 02/25/2014   Procedure: CYSTOSCOPY;  Surgeon: Jamey Reas de Berton Lan, MD;  Location: [facility];  Service: Gynecology;  Laterality: N/A;  . ROBOTIC ASSISTED TOTAL HYSTERECTOMY Bilateral 02/25/2014   Procedure: ROBOTIC ASSISTED TOTAL HYSTERECTOMY WITH BILATERAL SALPINGOOPHARECTOMY;  Surgeon: Jamey Reas de Berton Lan, MD;  Location: [facility];  Service: Gynecology;  Laterality: Bilateral;  . WISDOM TOOTH EXTRACTION  2007    Current Outpatient Prescriptions  Medication Sig Dispense Refill  . busPIRone (BUSPAR) 15 MG tablet Take 15 mg by mouth.    . cholecalciferol (VITAMIN D) 1000 UNITS tablet Take 1,000 Units by  mouth daily.    Scarlette Shorts SURECLICK 50 MG/ML injection     . estradiol (ESTRACE) 1 MG tablet Take 1 tablet (1 mg total) by mouth daily. 90 tablet 0  . fluticasone (FLONASE) 50 MCG/ACT nasal spray Place 1 spray into both nostrils daily.    . folic acid (FOLVITE) 1 MG tablet Take 1 mg by mouth.    . hydroxychloroquine (PLAQUENIL) 200 MG tablet Take 400 mg by mouth daily.     Marland Kitchen ibuprofen (ADVIL,MOTRIN) 600 MG tablet Take 1 tablet (600 mg total) by mouth every 6 (six) hours as needed (mild pain). 30 tablet 0  . lamoTRIgine (LAMICTAL) 150 MG tablet Take 1 tablet by mouth daily.    Marland Kitchen LORazepam (ATIVAN) 0.5 MG tablet Take 0.5 mg by mouth as needed.    . meloxicam (MOBIC) 15 MG tablet Take 15 mg by mouth daily.    . methotrexate (RHEUMATREX) 2.5 MG tablet Take 25 mg by mouth once a week. Every Sunday.  Takes 10 of the 2.5 mg tablets to = 25 mg dose    . Multiple Vitamin (MULTIVITAMIN) tablet Take 1 tablet by mouth daily.    . sertraline (ZOLOFT) 50 MG tablet Take 150 mg by mouth daily. 3 tabs daily     No current facility-administered medications for this visit.     Family History  Problem Relation Age of Onset  . Thyroid disease Mother     hypothyroid   .  Heart failure Father   . Dementia Father   . Stroke Father   . Hypertension Father   . Psoriasis Father   . Lupus Maternal Grandmother   . Breast cancer Maternal Grandmother 60    ROS:  Pertinent items are noted in HPI.  Otherwise, a comprehensive ROS was negative.  Exam:   LMP 02/10/2014 (Exact Date)    Ht Readings from Last 3 Encounters:  06/16/15 '5\' 11"'$  (1.803 m)  12/04/14 5' 11.25" (1.81 m)  08/28/14 5' 11.25" (1.81 m)    General appearance: alert, cooperative and appears stated age Head: Normocephalic, without obvious abnormality, atraumatic Neck: no adenopathy, supple, symmetrical, trachea midline and thyroid normal to inspection and palpation Lungs: clear to auscultation bilaterally Breasts: normal appearance, no masses or  tenderness Heart: regular rate and rhythm Abdomen: soft, non-tender; no masses,  no organomegaly Extremities: extremities normal, atraumatic, no cyanosis or edema Skin: Skin color, texture, turgor normal. No rashes or lesions Lymph nodes: Cervical, supraclavicular, and axillary nodes normal. No abnormal inguinal nodes palpated Neurologic: Grossly normal   Pelvic: External genitalia:  no lesions              Urethra:  normal appearing urethra with no masses, tenderness or lesions              Bartholin's and Skene's: normal                 Vagina: normal appearing vagina with redness on right upper vaginal vault, no lesions              Cervix: absent              Pap taken: Yes.   Bimanual Exam:  Uterus:  uterus absent              Adnexa: no mass, fullness, tenderness               Rectovaginal: Confirms               Anus:  normal sphincter tone, no lesions  Chaperone present: yes  A:  Well Woman with normal exam            S/P TAH / BSO 02/2014 secondary to AUB             RA since 2013             History of anxiety and depression  Update immunization   P:   Reviewed health and wellness pertinent to exam  Pap smear as above  Mammogram is due and pt is to call  Refill on Estrace 1 mg tablet for 1 month only until Mammo is done  Follow with pap  TDaP given today  Information is given about BRCA  Counseled on breast self exam, mammography screening, use and side effects of HRT, adequate intake of calcium and vitamin D, diet and exercise return annually or prn  An After Visit Summary was printed and given to the patient.

## 2016-06-22 NOTE — Patient Instructions (Signed)

## 2016-06-24 LAB — IPS PAP TEST WITH HPV

## 2016-06-25 NOTE — Progress Notes (Signed)
Encounter reviewed by Dr. Brook Amundson C. Silva.  

## 2016-07-26 ENCOUNTER — Other Ambulatory Visit: Payer: Self-pay | Admitting: Nurse Practitioner

## 2016-07-26 NOTE — Telephone Encounter (Signed)
Medication refill request: Estradiol Last AEX:  06-22-16 Next AEX: 06-28-17 Last MMG (if hormonal medication request): 07-12-16 WNL Refill authorized: please advise

## 2017-05-02 ENCOUNTER — Telehealth: Payer: Self-pay | Admitting: Certified Nurse Midwife

## 2017-05-02 NOTE — Telephone Encounter (Signed)
Left message for patient to call back to reschedule [address] appointment

## 2017-06-28 ENCOUNTER — Ambulatory Visit: Payer: BC Managed Care – PPO | Admitting: Nurse Practitioner

## 2017-07-14 ENCOUNTER — Encounter: Payer: Self-pay | Admitting: Certified Nurse Midwife

## 2017-07-14 ENCOUNTER — Ambulatory Visit (INDEPENDENT_AMBULATORY_CARE_PROVIDER_SITE_OTHER): Payer: BC Managed Care – PPO | Admitting: Certified Nurse Midwife

## 2017-07-14 VITALS — BP 102/64 | HR 80 | Resp 20 | Ht 71.25 in | Wt 215.0 lb

## 2017-07-14 DIAGNOSIS — Z1211 Encounter for screening for malignant neoplasm of colon: Secondary | ICD-10-CM

## 2017-07-14 DIAGNOSIS — Z79890 Hormone replacement therapy (postmenopausal): Secondary | ICD-10-CM | POA: Diagnosis not present

## 2017-07-14 DIAGNOSIS — N951 Menopausal and female climacteric states: Secondary | ICD-10-CM

## 2017-07-14 DIAGNOSIS — Z803 Family history of malignant neoplasm of breast: Secondary | ICD-10-CM | POA: Diagnosis not present

## 2017-07-14 DIAGNOSIS — Z01419 Encounter for gynecological examination (general) (routine) without abnormal findings: Secondary | ICD-10-CM

## 2017-07-14 NOTE — Progress Notes (Signed)
51 y.o. G0P0000 Married  Caucasian Fe here for annual exam. Denies vaginal discharge or dryness.Sees PCP for aex/labs/ and medication management of anxiety. Sees Rheumatology for RA management, stable.  Taking Estrace with good results. Denies any warning signs with headaches, breast pain or leg pain. Would like to continue at this point to help with hot flashes. Has mammogram appointment scheduled and aware needed. No health issues today.  Patient's last menstrual period was 02/10/2014 (exact date).          Sexually active: No.  The current method of family planning is status post hysterectomy.   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  exercise Smoker:  no  Health Maintenance: Pap:  4/13 neg per patient, 06-22-16 neg HPV HR neg History of Abnormal Pap: no MMG:  07-12-16 category b density birads 1:neg, has appt. Scheduled 07/1517 Self Breast exams: yes Colonoscopy:  none BMD:   2013 heel scan TDaP:  2017 Shingles: no Pneumonia: no Hep C and HIV: HIV done 1994-neg Labs: no   reports that she has never smoked. She has never used smokeless tobacco. She reports that she does not drink alcohol or use drugs.  Past Medical History:  Diagnosis Date  . Anxiety 1999  . Depression 1999  . RA (rheumatoid arthritis) ([place]) 2013   hands, elbows, knees  . Seasonal allergies     Past Surgical History:  Procedure Laterality Date  . CYSTOSCOPY N/A 02/25/2014   Procedure: CYSTOSCOPY;  Surgeon: Jamey Reas de Berton Lan, MD;  Location: [facility];  Service: Gynecology;  Laterality: N/A;  . ROBOTIC ASSISTED TOTAL HYSTERECTOMY Bilateral 02/25/2014   Procedure: ROBOTIC ASSISTED TOTAL HYSTERECTOMY WITH BILATERAL SALPINGOOPHARECTOMY;  Surgeon: Jamey Reas de Berton Lan, MD;  Location: [facility];  Service: Gynecology;  Laterality: Bilateral;  . WISDOM TOOTH EXTRACTION  2007    Current Outpatient Prescriptions  Medication Sig Dispense Refill  . busPIRone (BUSPAR) 15 MG tablet Take 15 mg by mouth.     . cholecalciferol (VITAMIN D) 1000 UNITS tablet Take 1,000 Units by mouth daily.    Scarlette Shorts SURECLICK 50 MG/ML injection     . estradiol (ESTRACE) 1 MG tablet TAKE 1 TABLET EVERY DAY 90 tablet 3  . fluticasone (FLONASE) 50 MCG/ACT nasal spray Place 1 spray into both nostrils daily.    . folic acid (FOLVITE) 1 MG tablet Take 1 mg by mouth.    . hydroxychloroquine (PLAQUENIL) 200 MG tablet Take 400 mg by mouth daily.     Marland Kitchen ibuprofen (ADVIL,MOTRIN) 600 MG tablet Take 1 tablet (600 mg total) by mouth every 6 (six) hours as needed (mild pain). 30 tablet 0  . lamoTRIgine (LAMICTAL) 150 MG tablet Take 1 tablet by mouth daily.    Marland Kitchen LORazepam (ATIVAN) 0.5 MG tablet Take 0.5 mg by mouth as needed.    . meloxicam (MOBIC) 15 MG tablet Take 15 mg by mouth daily.    . methotrexate (RHEUMATREX) 2.5 MG tablet Take 25 mg by mouth once a week. Every Sunday.  Takes 10 of the 2.5 mg tablets to = 25 mg dose    . Multiple Vitamin (MULTIVITAMIN) tablet Take 1 tablet by mouth daily.    . sertraline (ZOLOFT) 50 MG tablet Take 150 mg by mouth daily. 3 tabs daily     No current facility-administered medications for this visit.     Family History  Problem Relation Age of Onset  . Thyroid disease Mother        hypothyroid   .  Breast cancer Mother 25       lumpectomy, no other treatment  . Heart failure Father   . Dementia Father   . Stroke Father   . Hypertension Father   . Psoriasis Father   . Lupus Maternal Grandmother   . Breast cancer Maternal Grandmother 60    ROS:  Pertinent items are noted in HPI.  Otherwise, a comprehensive ROS was negative.  Exam:   BP 102/64   Pulse 80   Resp 20   Ht 5' 11.25" (1.81 m)   Wt 215 lb (97.5 kg)   LMP 02/10/2014 (Exact Date)   BMI 29.78 kg/m  Height: 5' 11.25" (181 cm) Ht Readings from Last 3 Encounters:  07/14/17 5' 11.25" (1.81 m)  06/22/16 5\' 11"  (1.803 m)  06/16/15 5\' 11"  (1.803 m)    General appearance: alert, cooperative and appears stated  age Head: Normocephalic, without obvious abnormality, atraumatic Neck: no adenopathy, supple, symmetrical, trachea midline and thyroid normal to inspection and palpation Lungs: clear to auscultation bilaterally Breasts: normal appearance, no masses or tenderness, No nipple retraction or dimpling, No nipple discharge or bleeding, No axillary or supraclavicular adenopathy Heart: regular rate and rhythm Abdomen: soft, non-tender; no masses,  no organomegaly Extremities: extremities normal, atraumatic, no cyanosis or edema Skin: Skin color, texture, turgor normal. No rashes or lesions Lymph nodes: Cervical, supraclavicular, and axillary nodes normal. No abnormal inguinal nodes palpated Neurologic: Grossly normal   Pelvic: External genitalia:  no lesions              Urethra:  normal appearing urethra with no masses, tenderness or lesions              Bartholin's and Skene's: normal                 Vagina: normal appearing vagina with normal color and discharge, no lesions              Cervix: absent              Pap taken: No. Bimanual Exam:  Uterus:  uterus absent              Adnexa: no mass, fullness, tenderness and adnexa surgically absent               Rectovaginal: Confirms               Anus:  normal sphincter tone, no lesions  Chaperone present: yes  A:  Well Woman with normal exam  Menopausal on ERT S/P TAH and BSO for bleeding per patient  Colonoscopy due  Anxiety/RA with MD management  Family history of breast cancer MGM,mother 108  P:   Reviewed health and wellness pertinent to exam  Discussed risk/benefits/side effects/warning signs of ERT, desires continuance  Rx Estrace 1 mg see order with instructions  Discussed risks/benefits of colonoscopy, desires referral. Patient will be called with appt. Information  Discussed BRACA screening, patient to check with sister and mother to see if done. Aware she can also do this, will consider.  Continue follow up with PCP as  indicated  Pap smear: no   counseled on breast self exam, mammography screening, feminine hygiene, adequate intake of calcium and vitamin D, diet and exercise, Kegel's exercises  return annually or prn  An After Visit Summary was printed and given to the patient.

## 2017-07-14 NOTE — Patient Instructions (Signed)

## 2017-07-21 ENCOUNTER — Other Ambulatory Visit: Payer: Self-pay

## 2017-07-21 MED ORDER — ESTRADIOL 1 MG PO TABS: 1.0000 mg | ORAL_TABLET | Freq: Every day | ORAL | 0 refills | Status: DC

## 2017-07-21 NOTE — Telephone Encounter (Signed)
Medication refill request: Estradiol Last AEX:  07/14/17 DL Next AEX: 08/03/18 Last MMG (if hormonal medication request): 07-12-16 category b density birads 1:neg, has appt. Scheduled 07/1517 Refill authorized: 07/26/16 #90 w/3 refills; today please advise

## 2017-07-21 NOTE — Telephone Encounter (Signed)
Holding for mammogram due this month

## 2017-07-24 ENCOUNTER — Telehealth: Payer: Self-pay | Admitting: Certified Nurse Midwife

## 2017-07-24 NOTE — Telephone Encounter (Signed)
Left voicemail regarding referral appointment. The information is listed below. Should the patient need to cancel or reschedule this appointment, Please advise them to call the office they've been referred to in order to reschedule. [facility] [address] [place], [place]  Phone: [phone]  Dr. Collene Mares 08/01/17 @ 9:30 am. Please arrive 15 minutes early and bring your insurance card and photo id and list of medications. Placed call to patient in regards to referral to [facility]. Upon return call from patient, will encourage patient to contact [facility] at [phone] to schedule recommend appointment.

## 2017-07-31 NOTE — Telephone Encounter (Signed)
MMG: 07/29/17 BIRADS 1 negative/density B -- please advise on refill

## 2017-08-09 ENCOUNTER — Encounter: Payer: Self-pay | Admitting: Obstetrics & Gynecology

## 2017-09-01 ENCOUNTER — Encounter: Payer: Self-pay | Admitting: Certified Nurse Midwife

## 2017-10-13 ENCOUNTER — Other Ambulatory Visit: Payer: Self-pay

## 2017-10-13 MED ORDER — ESTRADIOL 1 MG PO TABS: 1.0000 mg | ORAL_TABLET | Freq: Every day | ORAL | 2 refills | Status: DC

## 2017-10-13 NOTE — Telephone Encounter (Signed)
Medication refill request: Estradiol  Last AEX:  07/14/17 DL Next AEX: 08/03/18 Last MMG (if hormonal medication request): 07/29/17 BIRADS 1 negative Refill authorized: 07/21/17 #90 w/0 refills; today please advise

## 2018-07-04 ENCOUNTER — Other Ambulatory Visit: Payer: Self-pay | Admitting: Certified Nurse Midwife

## 2018-07-04 NOTE — Telephone Encounter (Signed)
Medication refill request: Estradiol  Last AEX:  07-14-17 DL  Next AEX: 08-03-18  Last MMG (if hormonal medication request): 07-29-17 density B/BIRADS 1 negative   Refill authorized: 10-13-17 #90,2RF. Today, please advise.   Medication pended for #30 as patient has aex scheduled 08-03-18.

## 2018-07-16 ENCOUNTER — Other Ambulatory Visit: Payer: Self-pay

## 2018-07-16 NOTE — Telephone Encounter (Signed)
Medication refill request: estrace 1mg  Last AEX:  07/14/17 Next AEX: 08/03/18 Last MMG (if hormonal medication request):07/29/17   Bi-rads 1 neg  Refill authorized: Please  Refill until AEX if appropriate

## 2018-07-16 NOTE — Telephone Encounter (Signed)
According to Rx sent it was last ordered a week ago, please call pharmacy and clarify

## 2018-07-17 NOTE — Telephone Encounter (Signed)
Confirmed pharmacy received estrace rx and will fill for patient.

## 2018-08-03 ENCOUNTER — Ambulatory Visit (INDEPENDENT_AMBULATORY_CARE_PROVIDER_SITE_OTHER): Payer: BC Managed Care – PPO | Admitting: Certified Nurse Midwife

## 2018-08-03 ENCOUNTER — Other Ambulatory Visit: Payer: Self-pay

## 2018-08-03 ENCOUNTER — Encounter: Payer: Self-pay | Admitting: Certified Nurse Midwife

## 2018-08-03 VITALS — BP 110/64 | HR 72 | Resp 16 | Ht 71.25 in | Wt 232.0 lb

## 2018-08-03 DIAGNOSIS — Z01419 Encounter for gynecological examination (general) (routine) without abnormal findings: Secondary | ICD-10-CM

## 2018-08-03 DIAGNOSIS — N951 Menopausal and female climacteric states: Secondary | ICD-10-CM

## 2018-08-03 DIAGNOSIS — Z803 Family history of malignant neoplasm of breast: Secondary | ICD-10-CM | POA: Diagnosis not present

## 2018-08-03 NOTE — Progress Notes (Signed)
52 y.o. G0P0000 Married  Caucasian Fe here for annual exam. Denies hot flashes or night sweats, vaginal dryness or bleeding. Aware of weight gain from sweets and plans to change this! Sees PCP and Rheumatology for labs and medication management of Lupus, anxiety,depression, Vitamin D, all stable per patient. Has mammogram scheduled and will need refill on Estrace, has enough until after exam. No other health concerns today. Planning visit with family this weekend.  Patient's last menstrual period was 02/10/2014 (exact date).          Sexually active: No.  The current method of family planning is status post hysterectomy.female partnet    Exercising: Yes.    walking Smoker:  no  Review of Systems  Constitutional:       Weight gain  HENT: Negative.   Eyes: Negative.   Respiratory: Negative.   Cardiovascular: Negative.   Gastrointestinal: Negative.   Genitourinary: Negative.   Musculoskeletal: Negative.   Skin: Negative.   Neurological: Negative.   Endo/Heme/Allergies: Negative.   Psychiatric/Behavioral: Positive for depression.    Health Maintenance: Pap:  06-22-16 neg HPV HR neg History of Abnormal Pap: no MMG:  07-29-17 category b density birads 1:neg, scheduled for next week Self Breast exams: no Colonoscopy:  2018 f/u 68yrs BMD:   2018 heel scan TDaP:  2017 Shingles: no Pneumonia: no Hep C and HIV: HIV neg 1994 Labs: PCP   reports that she has never smoked. She has never used smokeless tobacco. She reports that she does not drink alcohol or use drugs.  Past Medical History:  Diagnosis Date  . Anxiety 1999  . Depression 1999  . RA (rheumatoid arthritis) ([place]) 2013   hands, elbows, knees  . Seasonal allergies     Past Surgical History:  Procedure Laterality Date  . CYSTOSCOPY N/A 02/25/2014   Procedure: CYSTOSCOPY;  Surgeon: Jamey Reas de Berton Lan, MD;  Location: [facility];  Service: Gynecology;  Laterality: N/A;  . ROBOTIC ASSISTED TOTAL HYSTERECTOMY  Bilateral 02/25/2014   Procedure: ROBOTIC ASSISTED TOTAL HYSTERECTOMY WITH BILATERAL SALPINGOOPHARECTOMY;  Surgeon: Jamey Reas de Berton Lan, MD;  Location: [facility];  Service: Gynecology;  Laterality: Bilateral;  . WISDOM TOOTH EXTRACTION  2007    Current Outpatient Medications  Medication Sig Dispense Refill  . busPIRone (BUSPAR) 15 MG tablet Take 15 mg by mouth.    . cholecalciferol (VITAMIN D) 1000 UNITS tablet Take 1,000 Units by mouth daily.    Marland Kitchen estradiol (ESTRACE) 1 MG tablet TAKE 1 TABLET BY MOUTH EVERY DAY 30 tablet 0  . fluticasone (FLONASE) 50 MCG/ACT nasal spray Place 1 spray into both nostrils daily.    . folic acid (FOLVITE) 1 MG tablet Take 1 mg by mouth.    Marland Kitchen HUMIRA PEN 40 MG/0.4ML PNKT     . hydroxychloroquine (PLAQUENIL) 200 MG tablet Take 400 mg by mouth daily.     Marland Kitchen ibuprofen (ADVIL,MOTRIN) 600 MG tablet Take 1 tablet (600 mg total) by mouth every 6 (six) hours as needed (mild pain). 30 tablet 0  . lamoTRIgine (LAMICTAL) 150 MG tablet Take 1 tablet by mouth daily.    Marland Kitchen LORazepam (ATIVAN) 0.5 MG tablet Take 0.5 mg by mouth as needed.    . meloxicam (MOBIC) 15 MG tablet Take 15 mg by mouth daily.    . methotrexate (RHEUMATREX) 2.5 MG tablet Take 25 mg by mouth once a week. Every Sunday.  Takes 10 of the 2.5 mg tablets to = 25 mg dose    .  Multiple Vitamin (MULTIVITAMIN) tablet Take 1 tablet by mouth daily.    . sertraline (ZOLOFT) 50 MG tablet Take 150 mg by mouth daily. 3 tabs daily    . traMADol (ULTRAM) 50 MG tablet Take by mouth as needed.     No current facility-administered medications for this visit.     Family History  Problem Relation Age of Onset  . Thyroid disease Mother        hypothyroid   . Breast cancer Mother 37       lumpectomy, no other treatment  . Heart failure Father   . Dementia Father   . Stroke Father   . Hypertension Father   . Psoriasis Father   . Lupus Maternal Grandmother   . Breast cancer Maternal Grandmother 60    ROS:   Pertinent items are noted in HPI.  Otherwise, a comprehensive ROS was negative.  Exam:   BP 110/64   Pulse 72   Resp 16   Ht 5' 11.25" (1.81 m)   Wt 232 lb (105.2 kg)   LMP 02/10/2014 (Exact Date)   BMI 32.13 kg/m  Height: 5' 11.25" (181 cm) Ht Readings from Last 3 Encounters:  08/03/18 5' 11.25" (1.81 m)  07/14/17 5' 11.25" (1.81 m)  06/22/16 5\' 11"  (1.803 m)    General appearance: alert, cooperative and appears stated age Head: Normocephalic, without obvious abnormality, atraumatic Neck: no adenopathy, supple, symmetrical, trachea midline and thyroid normal to inspection and palpation Lungs: clear to auscultation bilaterally Breasts: normal appearance, no masses or tenderness, No nipple retraction or dimpling, No nipple discharge or bleeding, No axillary or supraclavicular adenopathy Heart: regular rate and rhythm Abdomen: soft, non-tender; no masses,  no organomegaly Extremities: extremities normal, atraumatic, no cyanosis or edema Skin: Skin color, texture, turgor normal. No rashes or lesions Lymph nodes: Cervical, supraclavicular, and axillary nodes normal. No abnormal inguinal nodes palpated Neurologic: Grossly normal   Pelvic: External genitalia:  no lesions              Urethra:  normal appearing urethra with no masses, tenderness or lesions              Bartholin's and Skene's: normal                 Vagina: normal appearing vagina with normal color and discharge, no lesions              Cervix: absent              Pap taken: No. Bimanual Exam:  Uterus:  uterus absent              Adnexa: no mass, fullness, tenderness and adnexa surgically absent               Rectovaginal: Confirms               Anus:  normal sphincter tone, no lesions  Chaperone present: yes  A:  Well Woman with normal exam  Menopausal on ERT working well, S/P TAH with BSO   Overweight with plans to decrease to under 200 over the next year.  Sees PCP and Rheumatology for Lupus and  anxiety,depression,vitamin D  Mammogram due and scheduled  Family history of breast cancer M(78) PGM  P:   Reviewed health and wellness pertinent to exam  Risks/benefits/warning signs of ERT use and desires continuance  Will refill once mammogram in, Patient to check with mother on [facility] testing and is aware she can have  this.  Discussed weight loss, portion control and exercise to help with maintain.  Continue follow up with MD as indicate  Pap smear: no   counseled on breast self exam, mammography screening, feminine hygiene, adequate intake of calcium and vitamin D, diet and exercise  return annually or prn  An After Visit Summary was printed and given to the patient.

## 2018-08-03 NOTE — Patient Instructions (Signed)

## 2018-08-08 ENCOUNTER — Other Ambulatory Visit: Payer: Self-pay | Admitting: Certified Nurse Midwife

## 2018-08-08 NOTE — Telephone Encounter (Signed)
Medication refill request: Estradial  Last AEX:  08/03/18 Next AEX: 07/20/19 Last MMG (if hormonal medication request):07/19/17 Bi-rads 1 neg  Refill authorized: #90 with 4RF

## 2018-08-08 NOTE — Telephone Encounter (Signed)
Need mammogram before refilled we discussed this

## 2018-08-20 ENCOUNTER — Other Ambulatory Visit: Payer: Self-pay

## 2018-08-20 MED ORDER — ESTRADIOL 1 MG PO TABS: 1.0000 mg | ORAL_TABLET | Freq: Every day | ORAL | 3 refills | Status: DC

## 2018-08-20 NOTE — Telephone Encounter (Signed)
Will refill, had noted waiting on Mammogram report

## 2018-08-20 NOTE — Telephone Encounter (Signed)
Medication refill request: estrace 1mg  tab  Last AEX:  08/03/18 DL Next AEX: 08/09/19 DL Last MMG (if hormonal medication request): 08/11/18 BIRADS1:Neg. Report in your office  Refill authorized: 07/04/18 #30/0R. Today please advise.

## 2018-10-02 ENCOUNTER — Encounter: Payer: Self-pay | Admitting: Certified Nurse Midwife

## 2019-08-01 ENCOUNTER — Other Ambulatory Visit: Payer: Self-pay | Admitting: Certified Nurse Midwife

## 2019-08-01 NOTE — Telephone Encounter (Signed)
Medication refill request: Estrace Last AEX:  08/05/2019 DL Next AEX: 08/09/2019 Last MMG (if hormonal medication request): 08/11/2018 BIRADS 1 Negative Density B Refill authorized: Pending #90 with no refills if appropriate. Please advise.

## 2019-08-09 ENCOUNTER — Encounter: Payer: Self-pay | Admitting: Certified Nurse Midwife

## 2019-08-09 ENCOUNTER — Ambulatory Visit: Payer: BC Managed Care – PPO | Admitting: Certified Nurse Midwife

## 2019-08-09 ENCOUNTER — Other Ambulatory Visit: Payer: Self-pay

## 2019-08-09 VITALS — BP 106/62 | HR 68 | Temp 97.0°F | Resp 16 | Ht 71.25 in | Wt 196.0 lb

## 2019-08-09 DIAGNOSIS — Z01419 Encounter for gynecological examination (general) (routine) without abnormal findings: Secondary | ICD-10-CM

## 2019-08-09 NOTE — Progress Notes (Signed)
53 y.o. G0P0000 Married  Caucasian Fe here for annual exam. Menopausal no hot flashes or night sweats now. No problems with vaginal dryness. ERT Estrace working well. Had shingles in 05/2019, with no sequelae. Has taken first Shingrex vaccine recently. Continues to see Rheumatology for RA, all medications same. PCP for aex, labs, depression management later this month. Mammogram scheduled for next week. No other health issues today. Taking a beach trip at Thanksgiving!  Patient's last menstrual period was 02/10/2014 (exact date).          Sexually active: No.  The current method of family planning is status post hysterectomy.    Exercising: Yes.    walking daily Smoker:  no  Review of Systems  Constitutional: Negative.   HENT: Negative.   Eyes: Negative.   Respiratory: Negative.   Cardiovascular: Negative.   Gastrointestinal: Negative.   Genitourinary: Negative.   Musculoskeletal: Negative.   Skin: Negative.   Neurological: Negative.   Endo/Heme/Allergies: Negative.   Psychiatric/Behavioral: Negative.     Health Maintenance: Pap:  06-22-16 neg HPV HR neg History of Abnormal Pap: no MMG:  08-11-18 category b density birads 1:neg Self Breast exams: yes Colonoscopy:  2018 f/u 39yrs BMD:   2018 heel scan TDaP:  2017 Shingles: 1st one 07/2019 Pneumonia: no Hep C and HIV: HIV neg 1994 Labs: if needed   reports that she has never smoked. She has never used smokeless tobacco. She reports that she does not drink alcohol or use drugs.  Past Medical History:  Diagnosis Date  . Anxiety 1999  . Depression 1999  . RA (rheumatoid arthritis) ([place]) 2013   hands, elbows, knees  . Seasonal allergies     Past Surgical History:  Procedure Laterality Date  . CYSTOSCOPY N/A 02/25/2014   Procedure: CYSTOSCOPY;  Surgeon: Jamey Reas de Berton Lan, MD;  Location: [facility];  Service: Gynecology;  Laterality: N/A;  . ROBOTIC ASSISTED TOTAL HYSTERECTOMY Bilateral 02/25/2014   Procedure:  ROBOTIC ASSISTED TOTAL HYSTERECTOMY WITH BILATERAL SALPINGOOPHARECTOMY;  Surgeon: Jamey Reas de Berton Lan, MD;  Location: [facility];  Service: Gynecology;  Laterality: Bilateral;  . WISDOM TOOTH EXTRACTION  2007    Current Outpatient Medications  Medication Sig Dispense Refill  . busPIRone (BUSPAR) 15 MG tablet Take 15 mg by mouth.    . cholecalciferol (VITAMIN D) 1000 UNITS tablet Take 1,000 Units by mouth daily.    Marland Kitchen estradiol (ESTRACE) 1 MG tablet TAKE 1 TABLET BY MOUTH EVERY DAY 90 tablet 0  . fluticasone (FLONASE) 50 MCG/ACT nasal spray Place 1 spray into both nostrils daily.    . folic acid (FOLVITE) 1 MG tablet Take 1 mg by mouth.    Marland Kitchen HUMIRA PEN 40 MG/0.4ML PNKT     . hydroxychloroquine (PLAQUENIL) 200 MG tablet Take 400 mg by mouth daily.     Marland Kitchen ibuprofen (ADVIL,MOTRIN) 600 MG tablet Take 1 tablet (600 mg total) by mouth every 6 (six) hours as needed (mild pain). 30 tablet 0  . lamoTRIgine (LAMICTAL) 150 MG tablet Take 1 tablet by mouth daily.    Marland Kitchen LORazepam (ATIVAN) 0.5 MG tablet Take 0.5 mg by mouth as needed.    . meloxicam (MOBIC) 15 MG tablet Take 15 mg by mouth daily.    . methotrexate (RHEUMATREX) 2.5 MG tablet Take 25 mg by mouth once a week. Every Sunday.  Takes 10 of the 2.5 mg tablets to = 25 mg dose    . Multiple Vitamin (MULTIVITAMIN) tablet Take  1 tablet by mouth daily.    . sertraline (ZOLOFT) 50 MG tablet Take 150 mg by mouth daily. 3 tabs daily    . traMADol (ULTRAM) 50 MG tablet Take by mouth as needed.     No current facility-administered medications for this visit.     Family History  Problem Relation Age of Onset  . Thyroid disease Mother        hypothyroid   . Breast cancer Mother 80       lumpectomy, no other treatment  . Heart failure Father   . Dementia Father   . Stroke Father   . Hypertension Father   . Psoriasis Father   . Lupus Maternal Grandmother   . Breast cancer Maternal Grandmother 60    ROS:  Pertinent items are noted in  HPI.  Otherwise, a comprehensive ROS was negative.  Exam:   LMP 02/10/2014 (Exact Date)    Ht Readings from Last 3 Encounters:  08/03/18 5' 11.25" (1.81 m)  07/14/17 5' 11.25" (1.81 m)  06/22/16 5\' 11"  (1.803 m)    General appearance: alert, cooperative and appears stated age Head: Normocephalic, without obvious abnormality, atraumatic Neck: no adenopathy, supple, symmetrical, trachea midline and thyroid normal to inspection and palpation Lungs: clear to auscultation bilaterally Breasts: normal appearance, no masses or tenderness, No nipple retraction or dimpling, No nipple discharge or bleeding, No axillary or supraclavicular adenopathy Heart: regular rate and rhythm Abdomen: soft, non-tender; no masses,  no organomegaly Extremities: extremities normal, atraumatic, no cyanosis or edema Skin: Skin color, texture, turgor normal. No rashes or lesions Lymph nodes: Cervical, supraclavicular, and axillary nodes normal. No abnormal inguinal nodes palpated Neurologic: Grossly normal   Pelvic: External genitalia:  no lesions              Urethra:  normal appearing urethra with no masses, tenderness or lesions              Bartholin's and Skene's: normal                 Vagina: normal appearing vagina with normal color and discharge, no lesions              Cervix: absent              Pap taken: No. Bimanual Exam:  Uterus:  uterus absent              Adnexa: no mass, fullness, tenderness and bilateral adnexa surgically absent               Rectovaginal: Confirms               Anus:  normal sphincter tone, no lesions  Chaperone present: yes  A:  Well Woman with normal exam  Menopausal on ERT s/p TAH with bilateral BSO  Intentional weight loss of 36 pounds  RA on Plaquenil and methotrexate with Rheumatology management  MD management of depression/ anxiety  Mammogram due has scheduled  P:   Reviewed health and wellness pertinent to exam  Discussed risks/benefits/warning signs with  ERT use, desires continuance  Will refill once mammogram in, patient has current 30 day supply.   Continue to work on weight loss maintenance for better health.  Continue follow up with MD's as indicated.  Pap smear: no   counseled on breast self exam, mammography screening, feminine hygiene, menopause, adequate intake of calcium and vitamin D, diet and exercise  return annually or prn  An After Visit Summary was  printed and given to the patient.

## 2019-08-09 NOTE — Patient Instructions (Signed)

## 2019-08-16 ENCOUNTER — Encounter: Payer: Self-pay | Admitting: Certified Nurse Midwife

## 2019-10-28 ENCOUNTER — Other Ambulatory Visit: Payer: Self-pay | Admitting: Certified Nurse Midwife

## 2019-10-28 NOTE — Telephone Encounter (Signed)
Medication refill request: Estradiol Last AEX:  08/09/19 DL Next AEX: none scheduled Last MMG (if hormonal medication request): 08/16/19 BIRADS 1 negative/density b Refill authorized: Please advise on refill

## 2020-01-06 ENCOUNTER — Encounter: Payer: Self-pay | Admitting: Certified Nurse Midwife

## 2020-09-01 ENCOUNTER — Encounter: Payer: Self-pay | Admitting: Obstetrics and Gynecology

## 2020-09-21 NOTE — Progress Notes (Signed)
54 y.o. G0P0000 Married White or Caucasian female here for annual exam.      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  Patient's last menstrual period was 02/10/2014 (exact date).         Hysterectomy for heavy bleeding Quincy Simmonds). Has been fine, not even hotflashed since. Taking estradiol. Would like to continue estrogen. Discussed pros/cons  Sexually active: No.  The current method of family planning is status post hysterectomy.    Exercising: Yes.    walking Smoker:  no  Health Maintenance: Pap:  06-22-16 neg HPV HR neg History of abnormal Pap:  no MMG:  08-21-2020 category b density birads 1:neg Colonoscopy:  2018 f/u 56yrs BMD:   2018 heel scan TDaP:  2017 Gardasil:  n/a Covid-19: moderna Pneumonia vaccine(s):  no Shingrix:   Did both per patient Hep C testing: not done Screening Labs: with PCP   reports that she has never smoked. She has never used smokeless tobacco. She reports current alcohol use. She reports that she does not use drugs.  Past Medical History:  Diagnosis Date  . Anxiety 1999  . Depression 1999  . RA (rheumatoid arthritis) ([place]) 2013   hands, elbows, knees  . Seasonal allergies   . Shingles     Past Surgical History:  Procedure Laterality Date  . CYSTOSCOPY N/A 02/25/2014   Procedure: CYSTOSCOPY;  Surgeon: Jamey Reas de Berton Lan, MD;  Location: [facility];  Service: Gynecology;  Laterality: N/A;  . ROBOTIC ASSISTED TOTAL HYSTERECTOMY Bilateral 02/25/2014   Procedure: ROBOTIC ASSISTED TOTAL HYSTERECTOMY WITH BILATERAL SALPINGOOPHARECTOMY;  Surgeon: Jamey Reas de Berton Lan, MD;  Location: [facility];  Service: Gynecology;  Laterality: Bilateral;  . WISDOM TOOTH EXTRACTION  2007    Current Outpatient Medications  Medication Sig Dispense Refill  . busPIRone (BUSPAR) 15 MG tablet Take 15 mg by mouth.    . cholecalciferol (VITAMIN D) 1000 UNITS tablet Take 1,000 Units by  mouth daily.    Marland Kitchen estradiol (ESTRACE) 1 MG tablet TAKE 1 TABLET BY MOUTH EVERY DAY 90 tablet 3  . fluticasone (FLONASE) 50 MCG/ACT nasal spray Place 1 spray into both nostrils daily.    . folic acid (FOLVITE) 1 MG tablet Take 1 mg by mouth.    Marland Kitchen HUMIRA PEN 40 MG/0.4ML PNKT     . hydroxychloroquine (PLAQUENIL) 200 MG tablet Take 400 mg by mouth daily.     Marland Kitchen ibuprofen (ADVIL,MOTRIN) 600 MG tablet Take 1 tablet (600 mg total) by mouth every 6 (six) hours as needed (mild pain). 30 tablet 0  . lamoTRIgine (LAMICTAL) 150 MG tablet Take 1 tablet by mouth daily.    Marland Kitchen LORazepam (ATIVAN) 0.5 MG tablet Take 0.5 mg by mouth as needed.    . meloxicam (MOBIC) 15 MG tablet Take 15 mg by mouth daily.    . methotrexate (RHEUMATREX) 2.5 MG tablet Take 25 mg by mouth once a week. Every Sunday.  Takes 10 of the 2.5 mg tablets to = 25 mg dose    . Multiple Vitamin (MULTIVITAMIN) tablet Take 1 tablet by mouth daily.    . sertraline (ZOLOFT) 50 MG tablet Take 150 mg by mouth daily. 3 tabs daily    . traMADol (ULTRAM) 50 MG tablet Take by mouth as needed.    . predniSONE (DELTASONE) 5 MG tablet Take 5 mg by mouth as needed. (Patient not taking: Reported on 09/22/2020)  No current facility-administered medications for this visit.    Family History  Problem Relation Age of Onset  . Thyroid disease Mother        hypothyroid   . Breast cancer Mother 29       lumpectomy, no other treatment  . Heart failure Father   . Dementia Father   . Stroke Father   . Hypertension Father   . Psoriasis Father   . Lupus Maternal Grandmother   . Breast cancer Maternal Grandmother 60    Review of Systems  Constitutional: Negative.   HENT: Negative.   Eyes: Negative.   Respiratory: Negative.   Cardiovascular: Negative.   Gastrointestinal: Negative.   Endocrine: Negative.   Genitourinary: Negative.   Musculoskeletal: Negative.   Skin: Negative.   Allergic/Immunologic: Negative.   Neurological: Negative.    Hematological: Negative.   Psychiatric/Behavioral: Negative.     Exam:   BP 110/64   Pulse 70   Resp 16   Ht 5' 11.75" (1.822 m)   Wt 208 lb (94.3 kg)   LMP 02/10/2014 (Exact Date)   BMI 28.41 kg/m   Height: 5' 11.75" (182.2 cm)  General appearance: alert, cooperative and appears stated age Head: Normocephalic, without obvious abnormality, atraumatic Neck: no adenopathy, supple, symmetrical, trachea midline and thyroid normal to inspection and palpation Lungs: clear to auscultation bilaterally Breasts: normal appearance, no masses or tenderness Heart: regular rate and rhythm Abdomen: soft, non-tender; bowel sounds normal; no masses,  no organomegaly Extremities: extremities normal, atraumatic, no cyanosis or edema Skin: Skin color, texture, turgor normal. No rashes or lesions Lymph nodes: Cervical, supraclavicular, and axillary nodes normal. No abnormal inguinal nodes palpated Neurologic: Grossly normal   Pelvic: External genitalia:  no lesions              Urethra:  normal appearing urethra with no masses, tenderness or lesions              Bartholins and Skenes: normal                 Vagina: normal appearing vagina for age, atrophic tissue, thin discharge              Cervix: absent              Pap taken: No. Bimanual Exam:  Uterus:  uterus absent              Adnexa: no mass, fullness, tenderness               Rectovaginal: Confirms               Anus:  normal sphincter tone, no lesions  Raquel Sarna, RN, Chaperone was present for exam.  A:  Well Woman with normal exam  Surgical menopause age 74, on estradiol x 6 years  P:   Mammogram UTD, 08/2020  pap smear not indicated  D/C oral estradiol, will transition to Estradiol patch, then gradually taper   return annually or prn

## 2020-09-22 ENCOUNTER — Other Ambulatory Visit: Payer: Self-pay

## 2020-09-22 ENCOUNTER — Encounter: Payer: Self-pay | Admitting: Nurse Practitioner

## 2020-09-22 ENCOUNTER — Ambulatory Visit: Payer: BC Managed Care – PPO | Admitting: Nurse Practitioner

## 2020-09-22 VITALS — BP 110/64 | HR 70 | Resp 16 | Ht 71.75 in | Wt 208.0 lb

## 2020-09-22 DIAGNOSIS — N951 Menopausal and female climacteric states: Secondary | ICD-10-CM | POA: Diagnosis not present

## 2020-09-22 DIAGNOSIS — Z01419 Encounter for gynecological examination (general) (routine) without abnormal findings: Secondary | ICD-10-CM

## 2020-09-22 MED ORDER — ESTRADIOL 0.05 MG/24HR TD PTTW: 1.0000 | MEDICATED_PATCH | TRANSDERMAL | 12 refills | Status: DC

## 2020-09-22 NOTE — Patient Instructions (Signed)
Estradiol skin patches What is this medicine? ESTRADIOL (es tra DYE ole) skin patches contain an estrogen. It is mostly used as hormone replacement in menopausal women. It helps to treat hot flashes and prevent osteoporosis. It is also used to treat women with low estrogen levels or those who have had their ovaries removed. This medicine may be used for other purposes; ask your health care provider or pharmacist if you have questions. COMMON BRAND NAME(S): Alora, Climara, DOTTI, Esclim, Estraderm, FemPatch, Menostar, Minivelle, Vivelle, Vivelle-Dot What should I tell my health care provider before I take this medicine? They need to know if you have any of these conditions:  abnormal vaginal bleeding  blood vessel disease or blood clots  breast, cervical, endometrial, ovarian, liver, or uterine cancer  dementia  diabetes  gallbladder disease  heart disease or recent heart attack  high blood pressure  high cholesterol  high level of calcium in the blood  hysterectomy  kidney disease  liver disease  migraine headaches  protein C deficiency  protein S deficiency  stroke  systemic lupus erythematosus (SLE)  tobacco smoker  an unusual or allergic reaction to estrogens, other hormones, medicines, foods, dyes, or preservatives  pregnant or trying to get pregnant  breast-feeding How should I use this medicine? This medicine is for external use only. Follow the directions on the prescription label. Tear open the pouch, do not use scissors. Remove the stiff protective liner covering the adhesive. Try not to touch the adhesive. Apply the patch, sticky side to the skin, to an area that is clean, dry and hairless. Avoid injured, irritated, calloused, or scarred areas. Do not apply the skin patches to your breasts or around the waistline. Use a different site each time to prevent skin irritation. Do not cut or trim the patch. Do not stop using except on the advice of your doctor  or health care professional. Do not wear more than one patch at a time unless you are told to do so by your doctor or health care professional. Contact your pediatrician regarding the use of this medicine in children. Special care may be needed. A patient package insert for the product will be given with each prescription and refill. Read this sheet carefully each time. The sheet may change frequently. Overdosage: If you think you have taken too much of this medicine contact a poison control center or emergency room at once. NOTE: This medicine is only for you. Do not share this medicine with others. What if I miss a dose? If you miss a dose, apply it as soon as you can. If it is almost time for your next dose, apply only that dose. Do not apply double or extra doses. What may interact with this medicine? Do not take this medicine with any of the following medications:  aromatase inhibitors like aminoglutethimide, anastrozole, exemestane, letrozole, testolactone This medicine may also interact with the following medications:  carbamazepine  certain antibiotics used to treat infections  certain barbiturates used for inducing sleep or treating seizures  grapefruit juice  medicines for fungus infections like itraconazole and ketoconazole  raloxifene or tamoxifen  rifabutin, rifampin, or rifapentine  ritonavir  St. John's Wort This list may not describe all possible interactions. Give your health care provider a list of all the medicines, herbs, non-prescription drugs, or dietary supplements you use. Also tell them if you smoke, drink alcohol, or use illegal drugs. Some items may interact with your medicine. What should I watch for while using this  medicine? Visit your doctor or health care professional for regular checks on your progress. You will need a regular breast and pelvic exam and Pap smear while on this medicine. You should also discuss the need for regular mammograms with your  health care professional, and follow his or her guidelines for these tests. This medicine can make your body retain fluid, making your fingers, hands, or ankles swell. Your blood pressure can go up. Contact your doctor or health care professional if you feel you are retaining fluid. If you have any reason to think you are pregnant, stop taking this medicine right away and contact your doctor or health care professional. Smoking increases the risk of getting a blood clot or having a stroke while you are taking this medicine, especially if you are more than 53 years old. You are strongly advised not to smoke. If you wear contact lenses and notice visual changes, or if the lenses begin to feel uncomfortable, consult your eye doctor or health care professional. This medicine can increase the risk of developing a condition (endometrial hyperplasia) that may lead to cancer of the lining of the uterus. Taking progestins, another hormone drug, with this medicine lowers the risk of developing this condition. Therefore, if your uterus has not been removed (by a hysterectomy), your doctor may prescribe a progestin for you to take together with your estrogen. You should know, however, that taking estrogens with progestins may have additional health risks. You should discuss the use of estrogens and progestins with your health care professional to determine the benefits and risks for you. If you are going to have surgery or an MRI, you may need to stop taking this medicine. Consult your health care professional for advice before you schedule the surgery. Contact with water while you are swimming, using a sauna, bathing, or showering may cause the patch to fall off. If your patch falls off reapply it. If you cannot reapply the patch, apply a new patch to another area and continue to follow your usual dose schedule. What side effects may I notice from receiving this medicine? Side effects that you should report to your  doctor or health care professional as soon as possible:  allergic reactions like skin rash, itching or hives, swelling of the face, lips, or tongue  breast tissue changes or discharge  changes in vision  chest pain  confusion, trouble speaking or understanding  dark urine  general ill feeling or flu-like symptoms  light-colored stools  nausea, vomiting  pain, swelling, warmth in the leg  right upper belly pain  severe headaches  shortness of breath  sudden numbness or weakness of the face, arm or leg  trouble walking, dizziness, loss of balance or coordination  unusual vaginal bleeding  yellowing of the eyes or skin Side effects that usually do not require medical attention (report to your doctor or health care professional if they continue or are bothersome):  hair loss  increased hunger or thirst  increased urination  symptoms of vaginal infection like itching, irritation or unusual discharge  unusually weak or tired This list may not describe all possible side effects. Call your doctor for medical advice about side effects. You may report side effects to FDA at 1-800-FDA-1088. Where should I keep my medicine? Keep out of the reach of children. Store at room temperature below 30 degrees C (86 degrees F). Do not store any patches that have been removed from their protective pouch. Throw away any unused medicine after the  expiration date. Dispose of used patches properly. Since used patches may still contain active hormones, fold the patch in half so that it sticks to itself prior to disposal. NOTE: This sheet is a summary. It may not cover all possible information. If you have questions about this medicine, talk to your doctor, pharmacist, or health care provider.  2020 Elsevier/Gold Standard (2016-04-26 12:58:11)   Health Maintenance for Postmenopausal Women Menopause is a normal process in which your ability to get pregnant comes to an end. This process  happens slowly over many months or years, usually between the ages of 37 and 3. Menopause is complete when you have missed your menstrual periods for 12 months. It is important to talk with your health care provider about some of the most common conditions that affect women after menopause (postmenopausal women). These include heart disease, cancer, and bone loss (osteoporosis). Adopting a healthy lifestyle and getting preventive care can help to promote your health and wellness. The actions you take can also lower your chances of developing some of these common conditions. What should I know about menopause? During menopause, you may get a number of symptoms, such as:  Hot flashes. These can be moderate or severe.  Night sweats.  Decrease in sex drive.  Mood swings.  Headaches.  Tiredness.  Irritability.  Memory problems.  Insomnia. Choosing to treat or not to treat these symptoms is a decision that you make with your health care provider. Do I need hormone replacement therapy?  Hormone replacement therapy is effective in treating symptoms that are caused by menopause, such as hot flashes and night sweats.  Hormone replacement carries certain risks, especially as you become older. If you are thinking about using estrogen or estrogen with progestin, discuss the benefits and risks with your health care provider. What is my risk for heart disease and stroke? The risk of heart disease, heart attack, and stroke increases as you age. One of the causes may be a change in the body's hormones during menopause. This can affect how your body uses dietary fats, triglycerides, and cholesterol. Heart attack and stroke are medical emergencies. There are many things that you can do to help prevent heart disease and stroke. Watch your blood pressure  High blood pressure causes heart disease and increases the risk of stroke. This is more likely to develop in people who have high blood pressure  readings, are of African descent, or are overweight.  Have your blood pressure checked: ? Every 3-5 years if you are 49-65 years of age. ? Every year if you are 8 years old or older. Eat a healthy diet   Eat a diet that includes plenty of vegetables, fruits, low-fat dairy products, and lean protein.  Do not eat a lot of foods that are high in solid fats, added sugars, or sodium. Get regular exercise Get regular exercise. This is one of the most important things you can do for your health. Most adults should:  Try to exercise for at least 150 minutes each week. The exercise should increase your heart rate and make you sweat (moderate-intensity exercise).  Try to do strengthening exercises at least twice each week. Do these in addition to the moderate-intensity exercise.  Spend less time sitting. Even light physical activity can be beneficial. Other tips  Work with your health care provider to achieve or maintain a healthy weight.  Do not use any products that contain nicotine or tobacco, such as cigarettes, e-cigarettes, and chewing tobacco. If you  need help quitting, ask your health care provider.  Know your numbers. Ask your health care provider to check your cholesterol and your blood sugar (glucose). Continue to have your blood tested as directed by your health care provider. Do I need screening for cancer? Depending on your health history and family history, you may need to have cancer screening at different stages of your life. This may include screening for:  Breast cancer.  Cervical cancer.  Lung cancer.  Colorectal cancer. What is my risk for osteoporosis? After menopause, you may be at increased risk for osteoporosis. Osteoporosis is a condition in which bone destruction happens more quickly than new bone creation. To help prevent osteoporosis or the bone fractures that can happen because of osteoporosis, you may take the following actions:  If you are 35-50 years  old, get at least 1,000 mg of calcium and at least 600 mg of vitamin D per day.  If you are older than age 78 but younger than age 81, get at least 1,200 mg of calcium and at least 600 mg of vitamin D per day.  If you are older than age 29, get at least 1,200 mg of calcium and at least 800 mg of vitamin D per day. Smoking and drinking excessive alcohol increase the risk of osteoporosis. Eat foods that are rich in calcium and vitamin D, and do weight-bearing exercises several times each week as directed by your health care provider. How does menopause affect my mental health? Depression may occur at any age, but it is more common as you become older. Common symptoms of depression include:  Low or sad mood.  Changes in sleep patterns.  Changes in appetite or eating patterns.  Feeling an overall lack of motivation or enjoyment of activities that you previously enjoyed.  Frequent crying spells. Talk with your health care provider if you think that you are experiencing depression. General instructions See your health care provider for regular wellness exams and vaccines. This may include:  Scheduling regular health, dental, and eye exams.  Getting and maintaining your vaccines. These include: ? Influenza vaccine. Get this vaccine each year before the flu season begins. ? Pneumonia vaccine. ? Shingles vaccine. ? Tetanus, diphtheria, and pertussis (Tdap) booster vaccine. Your health care provider may also recommend other immunizations. Tell your health care provider if you have ever been abused or do not feel safe at home. Summary  Menopause is a normal process in which your ability to get pregnant comes to an end.  This condition causes hot flashes, night sweats, decreased interest in sex, mood swings, headaches, or lack of sleep.  Treatment for this condition may include hormone replacement therapy.  Take actions to keep yourself healthy, including exercising regularly, eating a  healthy diet, watching your weight, and checking your blood pressure and blood sugar levels.  Get screened for cancer and depression. Make sure that you are up to date with all your vaccines. This information is not intended to replace advice given to you by your health care provider. Make sure you discuss any questions you have with your health care provider. Document Revised: 09/26/2018 Document Reviewed: 09/26/2018 Elsevier Patient Education  2020 [employer].

## 2020-12-10 ENCOUNTER — Telehealth: Payer: Self-pay | Admitting: *Deleted

## 2020-12-10 ENCOUNTER — Other Ambulatory Visit: Payer: Self-pay | Admitting: Nurse Practitioner

## 2020-12-10 DIAGNOSIS — N951 Menopausal and female climacteric states: Secondary | ICD-10-CM

## 2020-12-10 MED ORDER — ESTRADIOL 1 MG PO TABS: 1.0000 mg | ORAL_TABLET | Freq: Every day | ORAL | 4 refills | Status: DC

## 2020-12-10 NOTE — Progress Notes (Signed)
Rx sent for estradiol (oral) as requested. D/C patch.

## 2020-12-10 NOTE — Telephone Encounter (Signed)
Oral estradiol 1mg  sent as patient requested. Will revisit at her next annual exam. Please notify. Karma Ganja, [facility]

## 2020-12-10 NOTE — Telephone Encounter (Signed)
Patient called requesting to start back on estradiol pill from, currently taking estradiol patch c/o mood swings and irritation,rage. Patient said all this is starting to affect her work life and social life. Please advise

## 2020-12-11 NOTE — Telephone Encounter (Signed)
Patient informed. Kelly sent Rx.

## 2021-09-22 ENCOUNTER — Encounter: Payer: Self-pay | Admitting: Obstetrics and Gynecology

## 2021-09-22 NOTE — Progress Notes (Signed)
55 y.o. G80P0000 Married Caucasian female here for annual exam.    Estrogen is working well.  She is on her ERT since 2015.  She would like to continue.   RA is managed well right now.  Patient's wife's parents died this year.  Bought their house and her mother moved in.  Working from home.   PCP: Jonathon Jordan, MD   Rheumatology:  Berna Bue, MD  Patient's last menstrual period was 02/10/2014 (exact date).           Sexually active: No.  The current method of family planning is status post hysterectomy.    Exercising: Yes.     walking Smoker:  no  Health Maintenance: Pap: 06-22-16 neg HPV HR neg History of abnormal Pap:  no MMG: 09-18-21- [facility] birads 1 neg Colonoscopy: 09/01/17- f/u in 10 yrs BMD:   none  Result  n/a TDaP: 06/22/16 Gardasil:   no HIV: neg yrs ago per patient Hep C: neg yrs ago per patient Screening Labs:  PCP.  Flu vaccine:  completed.  Covid:  bivalent booster completed.    reports that she has never smoked. She has never used smokeless tobacco. She reports current alcohol use. She reports that she does not use drugs.  Past Medical History:  Diagnosis Date   Anxiety 1999   Depression 1999   RA (rheumatoid arthritis) ([place]) 2013   hands, elbows, knees   Seasonal allergies    Shingles     Past Surgical History:  Procedure Laterality Date   CYSTOSCOPY N/A 02/25/2014   Procedure: CYSTOSCOPY;  Surgeon: Jamey Reas de Berton Lan, MD;  Location: [facility];  Service: Gynecology;  Laterality: N/A;   ROBOTIC ASSISTED TOTAL HYSTERECTOMY Bilateral 02/25/2014   Procedure: ROBOTIC ASSISTED TOTAL HYSTERECTOMY WITH BILATERAL SALPINGOOPHARECTOMY;  Surgeon: Jamey Reas de Berton Lan, MD;  Location: [facility];  Service: Gynecology;  Laterality: Bilateral;   WISDOM TOOTH EXTRACTION  2007    Current Outpatient Medications  Medication Sig Dispense Refill   busPIRone (BUSPAR) 15 MG tablet Take 15 mg by mouth.     cholecalciferol (VITAMIN D) 1000  UNITS tablet Take 1,000 Units by mouth daily.     estradiol (ESTRACE) 1 MG tablet Take 1 tablet (1 mg total) by mouth daily. 90 tablet 4   fluticasone (FLONASE) 50 MCG/ACT nasal spray Place 1 spray into both nostrils daily.     folic acid (FOLVITE) 1 MG tablet Take 1 mg by mouth.     HUMIRA PEN 40 MG/0.4ML PNKT      hydroxychloroquine (PLAQUENIL) 200 MG tablet Take 400 mg by mouth daily.      lamoTRIgine (LAMICTAL) 150 MG tablet Take 1 tablet by mouth daily.     LORazepam (ATIVAN) 0.5 MG tablet Take 0.5 mg by mouth as needed.     meloxicam (MOBIC) 15 MG tablet Take 15 mg by mouth daily.     methotrexate (RHEUMATREX) 2.5 MG tablet Take 25 mg by mouth once a week. Every Sunday.  Takes 10 of the 2.5 mg tablets to = 25 mg dose     Multiple Vitamin (MULTIVITAMIN) tablet Take 1 tablet by mouth daily.     sertraline (ZOLOFT) 50 MG tablet Take 150 mg by mouth daily. 3 tabs daily     traMADol (ULTRAM) 50 MG tablet Take by mouth as needed.     No current facility-administered medications for this visit.    Family History  Problem Relation Age of Onset  Thyroid disease Mother        hypothyroid    Breast cancer Mother 51       lumpectomy, no other treatment   Heart failure Father    Dementia Father    Stroke Father    Hypertension Father    Psoriasis Father    Lupus Maternal Grandmother    Breast cancer Maternal Grandmother 60    Review of Systems  Constitutional: Negative.   HENT: Negative.    Eyes: Negative.   Respiratory: Negative.    Cardiovascular: Negative.   Gastrointestinal: Negative.   Endocrine: Negative.   Genitourinary: Negative.   Musculoskeletal: Negative.   Skin: Negative.   Allergic/Immunologic: Negative.   Neurological: Negative.   Hematological: Negative.   Psychiatric/Behavioral: Negative.     Exam:   BP 120/82   Pulse 82   Resp 16   Ht 5' 10.75" (1.797 m)   Wt 219 lb (99.3 kg)   LMP 02/10/2014 (Exact Date)   BMI 30.76 kg/m     General appearance:  alert, cooperative and appears stated age Head: normocephalic, without obvious abnormality, atraumatic Neck: no adenopathy, supple, symmetrical, trachea midline and thyroid normal to inspection and palpation Lungs: clear to auscultation bilaterally Breasts: normal appearance, no masses or tenderness, No nipple retraction or dimpling, No nipple discharge or bleeding, No axillary adenopathy Heart: regular rate and rhythm Abdomen: soft, non-tender; no masses, no organomegaly Extremities: extremities normal, atraumatic, no cyanosis or edema Skin: skin color, texture, turgor normal. No rashes or lesions Lymph nodes: cervical, supraclavicular, and axillary nodes normal. Neurologic: grossly normal  Pelvic: External genitalia:  no lesions              No abnormal inguinal nodes palpated.              Urethra:  normal appearing urethra with no masses, tenderness or lesions              Bartholins and Skenes: normal                 Vagina: atrophy noted, no lesions              Cervix: absent              Pap taken: no Bimanual Exam:  Uterus:  absent              Adnexa: no mass, fullness, tenderness              Rectal exam: yes.  Confirms.              Anus:  normal sphincter tone, no lesions  Chaperone was present for exam:  Joy, CMA  Assessment:   Well woman visit with gynecologic exam. Status post robotic total laparoscopic hysterectomy with bilateral salpingo-oophorectomy, cystoscopy 2015.  On ERT.  Vaginal atrophy.  RA.  Plan: Mammogram screening discussed. Self breast awareness reviewed. Pap and HR HPV as above. Guidelines for Calcium, Vitamin D, regular exercise program including cardiovascular and weight bearing exercise. WHI discussed and potential increased risk of PE, DVT, stroke with estrogen use.  She wishes to continue ERT in her current dosage.  Rx for Estrace 1 mg daily.  #90, RF 4.  We discussed water based lubricants, cooking oils, vaginal vit E, and vaginal estrogen  for atrophy if needed.  She declines.  She will discuss BMD with her rheumatologist.  Follow up annually and prn.   After visit summary provided.

## 2021-09-23 ENCOUNTER — Other Ambulatory Visit: Payer: Self-pay

## 2021-09-23 ENCOUNTER — Ambulatory Visit: Payer: BC Managed Care – PPO | Admitting: Nurse Practitioner

## 2021-09-23 ENCOUNTER — Encounter: Payer: Self-pay | Admitting: Obstetrics and Gynecology

## 2021-09-23 ENCOUNTER — Ambulatory Visit (INDEPENDENT_AMBULATORY_CARE_PROVIDER_SITE_OTHER): Payer: BC Managed Care – PPO | Admitting: Obstetrics and Gynecology

## 2021-09-23 VITALS — BP 120/82 | HR 82 | Resp 16 | Ht 70.75 in | Wt 219.0 lb

## 2021-09-23 DIAGNOSIS — Z79899 Other long term (current) drug therapy: Secondary | ICD-10-CM | POA: Diagnosis not present

## 2021-09-23 DIAGNOSIS — Z01419 Encounter for gynecological examination (general) (routine) without abnormal findings: Secondary | ICD-10-CM | POA: Diagnosis not present

## 2021-09-23 DIAGNOSIS — Z79818 Long term (current) use of other agents affecting estrogen receptors and estrogen levels: Secondary | ICD-10-CM

## 2021-09-23 DIAGNOSIS — N951 Menopausal and female climacteric states: Secondary | ICD-10-CM

## 2021-09-23 MED ORDER — ESTRADIOL 1 MG PO TABS: 1.0000 mg | ORAL_TABLET | Freq: Every day | ORAL | 4 refills | Status: DC

## 2021-09-23 NOTE — Patient Instructions (Signed)

## 2021-11-23 ENCOUNTER — Telehealth: Payer: BC Managed Care – PPO | Admitting: Family Medicine

## 2021-11-23 DIAGNOSIS — U071 COVID-19: Secondary | ICD-10-CM

## 2021-11-23 MED ORDER — MOLNUPIRAVIR EUA 200MG CAPSULE: 4.0000 | ORAL_CAPSULE | Freq: Two times a day (BID) | ORAL | 0 refills | Status: AC

## 2021-11-23 NOTE — Progress Notes (Signed)
Virtual Visit Consent   Jenny Bailey, you are scheduled for a virtual visit with a [facility] provider today.     Just as with appointments in the office, your consent must be obtained to participate.  Your consent will be active for this visit and any virtual visit you may have with one of our providers in the next 365 days.     If you have a MyChart account, a copy of this consent can be sent to you electronically.  All virtual visits are billed to your insurance company just like a traditional visit in the office.    As this is a virtual visit, video technology does not allow for your provider to perform a traditional examination.  This may limit your provider's ability to fully assess your condition.  If your provider identifies any concerns that need to be evaluated in person or the need to arrange testing (such as labs, EKG, etc.), we will make arrangements to do so.     Although advances in technology are sophisticated, we cannot ensure that it will always work on either your end or our end.  If the connection with a video visit is poor, the visit may have to be switched to a telephone visit.  With either a video or telephone visit, we are not always able to ensure that we have a secure connection.     I need to obtain your verbal consent now.   Are you willing to proceed with your visit today?    Jenny Bailey has provided verbal consent on 11/23/2021 for a virtual visit (video or telephone).   [name], NP   Date: 11/23/2021 12:15 PM   Virtual Visit via Video Note   I, [name], connected with  Jenny Bailey  ([id number], Feb 02, 1966) on 11/23/21 at 12:15 PM EST by a video-enabled telemedicine application and verified that I am speaking with the correct person using two identifiers.  Location: Patient: Virtual Visit Location Patient: Home Provider: Virtual Visit Location Provider: Home Office   I discussed the limitations of evaluation and management by telemedicine and the  availability of in person appointments. The patient expressed understanding and agreed to proceed.    History of Present Illness: Jenny Bailey is a 56 y.o. who identifies as a female who was assigned female at birth, and is being seen today for covid + HT as of yesterday  Onset was Sunday- started feeling badly with fever, sore throat, and chills, body aches, tired. Symptoms have continued. T Max was 101. I have been taking Tylenol - fever is responding to this. Denies cough, chest pain, shortness of breath, ear pain, headache.  Vaccinate with boosters   Flu vaccine as well   Has RA on Humira, hydroxychloroquine, methotrexate.   Current Outpatient Medications:    busPIRone (BUSPAR) 15 MG tablet, Take 15 mg by mouth., Disp: , Rfl:    cholecalciferol (VITAMIN D) 1000 UNITS tablet, Take 1,000 Units by mouth daily., Disp: , Rfl:    estradiol (ESTRACE) 1 MG tablet, Take 1 tablet (1 mg total) by mouth daily., Disp: 90 tablet, Rfl: 4   fluticasone (FLONASE) 50 MCG/ACT nasal spray, Place 1 spray into both nostrils daily., Disp: , Rfl:    folic acid (FOLVITE) 1 MG tablet, Take 1 mg by mouth., Disp: , Rfl:    HUMIRA PEN 40 MG/0.4ML PNKT, , Disp: , Rfl:    hydroxychloroquine (PLAQUENIL) 200 MG tablet, Take 400 mg by mouth daily. , Disp: ,  Rfl:    lamoTRIgine (LAMICTAL) 150 MG tablet, Take 1 tablet by mouth daily., Disp: , Rfl:    LORazepam (ATIVAN) 0.5 MG tablet, Take 0.5 mg by mouth as needed., Disp: , Rfl:    meloxicam (MOBIC) 15 MG tablet, Take 15 mg by mouth daily., Disp: , Rfl:    methotrexate (RHEUMATREX) 2.5 MG tablet, Take 25 mg by mouth once a week. Every Sunday.  Takes 10 of the 2.5 mg tablets to = 25 mg dose, Disp: , Rfl:    Multiple Vitamin (MULTIVITAMIN) tablet, Take 1 tablet by mouth daily., Disp: , Rfl:    sertraline (ZOLOFT) 50 MG tablet, Take 150 mg by mouth daily. 3 tabs daily, Disp: , Rfl:    traMADol (ULTRAM) 50 MG tablet, Take by mouth as needed., Disp: , Rfl:   HPI: HPI   Problems:  Patient Active Problem List   Diagnosis Date Noted   Arthritis, rheumatoid ([place]) 06/16/2015   Post-menopause on HRT (hormone replacement therapy) 06/16/2015   Anxiety and depression 06/16/2015    Allergies: No Known Allergies Medications:  Current Outpatient Medications:    busPIRone (BUSPAR) 15 MG tablet, Take 15 mg by mouth., Disp: , Rfl:    cholecalciferol (VITAMIN D) 1000 UNITS tablet, Take 1,000 Units by mouth daily., Disp: , Rfl:    estradiol (ESTRACE) 1 MG tablet, Take 1 tablet (1 mg total) by mouth daily., Disp: 90 tablet, Rfl: 4   fluticasone (FLONASE) 50 MCG/ACT nasal spray, Place 1 spray into both nostrils daily., Disp: , Rfl:    folic acid (FOLVITE) 1 MG tablet, Take 1 mg by mouth., Disp: , Rfl:    HUMIRA PEN 40 MG/0.4ML PNKT, , Disp: , Rfl:    hydroxychloroquine (PLAQUENIL) 200 MG tablet, Take 400 mg by mouth daily. , Disp: , Rfl:    lamoTRIgine (LAMICTAL) 150 MG tablet, Take 1 tablet by mouth daily., Disp: , Rfl:    LORazepam (ATIVAN) 0.5 MG tablet, Take 0.5 mg by mouth as needed., Disp: , Rfl:    meloxicam (MOBIC) 15 MG tablet, Take 15 mg by mouth daily., Disp: , Rfl:    methotrexate (RHEUMATREX) 2.5 MG tablet, Take 25 mg by mouth once a week. Every Sunday.  Takes 10 of the 2.5 mg tablets to = 25 mg dose, Disp: , Rfl:    Multiple Vitamin (MULTIVITAMIN) tablet, Take 1 tablet by mouth daily., Disp: , Rfl:    sertraline (ZOLOFT) 50 MG tablet, Take 150 mg by mouth daily. 3 tabs daily, Disp: , Rfl:    traMADol (ULTRAM) 50 MG tablet, Take by mouth as needed., Disp: , Rfl:   Observations/Objective: Patient is well-developed, well-nourished in no acute distress.  Resting comfortably  at home.  Head is normocephalic, atraumatic.  No labored breathing.  Speech is clear and coherent with logical content.  Patient is alert and oriented at baseline.    Assessment and Plan:    Follow Up Instructions: I discussed the assessment and treatment plan with the patient.  The patient was provided an opportunity to ask questions and all were answered. The patient agreed with the plan and demonstrated an understanding of the instructions.  A copy of instructions were sent to the patient via MyChart unless otherwise noted below.     The patient was advised to call back or seek an in-person evaluation if the symptoms worsen or if the condition fails to improve as anticipated.  Time:  I spent 10 minutes with the patient via telehealth technology discussing the  above problems/concerns.    [name], NP

## 2021-11-23 NOTE — Patient Instructions (Signed)
Please keep well-hydrated and get plenty of rest. Start a saline nasal rinse to flush out your nasal passages. You can use plain Mucinex to help thin congestion. If you have a humidifier, running in the bedroom at night. I want you to start OTC vitamin D3 1000 units daily, vitamin C 1000 mg daily, and a zinc supplement. Please take prescribed medications as directed.  You have been enrolled in a MyChart symptom monitoring program. Please answer these questions daily so we can keep track of how you are doing.  You were to quarantine for 5 days from onset of your symptoms.  After day 5, if you have had no fever and you are feeling better, you can end quarantine but need to mask for an additional 5 days. After day 5 if you have a fever or are having significant symptoms, please quarantine for full 10 days.  If you note any worsening of symptoms, any significant shortness of breath or any chest pain, please seek ER evaluation ASAP.  Please do not delay care!  COVID-19: What to Do if You Are Sick If you test positive and are an older adult or someone who is at high risk of getting very sick from COVID-19, treatment may be available. Contact a healthcare provider right away after a positive test to determine if you are eligible, even if your symptoms are mild right now. You can also visit a Test to Treat location and, if eligible, receive a prescription from a provider. Don't delay: Treatment must be started within the first few days to be effective. If you have a fever, cough, or other symptoms, you might have COVID-19. Most people have mild illness and are able to recover at home. If you are sick: Keep track of your symptoms. If you have an emergency warning sign (including trouble breathing), call 911. Steps to help prevent the spread of COVID-19 if you are sick If you are sick with COVID-19 or think you might have COVID-19, follow the steps below to care for yourself and to help protect other  people in your home and community. Stay home except to get medical care Stay home. Most people with COVID-19 have mild illness and can recover at home without medical care. Do not leave your home, except to get medical care. Do not visit public areas and do not go to places where you are unable to wear a mask. Take care of yourself. Get rest and stay hydrated. Take over-the-counter medicines, such as acetaminophen, to help you feel better. Stay in touch with your doctor. Call before you get medical care. Be sure to get care if you have trouble breathing, or have any other emergency warning signs, or if you think it is an emergency. Avoid public transportation, ride-sharing, or taxis if possible. Get tested If you have symptoms of COVID-19, get tested. While waiting for test results, stay away from others, including staying apart from those living in your household. Get tested as soon as possible after your symptoms start. Treatments may be available for people with COVID-19 who are at risk for becoming very sick. Don't delay: Treatment must be started early to be effective--some treatments must begin within 5 days of your first symptoms. Contact your healthcare provider right away if your test result is positive to determine if you are eligible. Self-tests are one of several options for testing for the virus that causes COVID-19 and may be more convenient than laboratory-based tests and point-of-care tests. Ask your healthcare provider or   your local health department if you need help interpreting your test results. You can visit your state, tribal, local, and territorial health department's website to look for the latest local information on testing sites. Separate yourself from other people As much as possible, stay in a specific room and away from other people and pets in your home. If possible, you should use a separate bathroom. If you need to be around other people or animals in or outside of the  home, wear a well-fitting mask. Tell your close contacts that they may have been exposed to COVID-19. An infected person can spread COVID-19 starting 48 hours (or 2 days) before the person has any symptoms or tests positive. By letting your close contacts know they may have been exposed to COVID-19, you are helping to protect everyone. See COVID-19 and Animals if you have questions about pets. If you are diagnosed with COVID-19, someone from the health department may call you. Answer the call to slow the spread. Monitor your symptoms Symptoms of COVID-19 include fever, cough, or other symptoms. Follow care instructions from your healthcare provider and local health department. Your local health authorities may give instructions on checking your symptoms and reporting information. When to seek emergency medical attention Look for emergency warning signs* for COVID-19. If someone is showing any of these signs, seek emergency medical care immediately: Trouble breathing Persistent pain or pressure in the chest New confusion Inability to wake or stay awake Pale, gray, or blue-colored skin, lips, or nail beds, depending on skin tone *This list is not all possible symptoms. Please call your medical provider for any other symptoms that are severe or concerning to you. Call 911 or call ahead to your local emergency facility: Notify the operator that you are seeking care for someone who has or may have COVID-19. Call ahead before visiting your doctor Call ahead. Many medical visits for routine care are being postponed or done by phone or telemedicine. If you have a medical appointment that cannot be postponed, call your doctor's office, and tell them you have or may have COVID-19. This will help the office protect themselves and other patients. If you are sick, wear a well-fitting mask You should wear a mask if you must be around other people or animals, including pets (even at home). Wear a mask with the  best fit, protection, and comfort for you. You don't need to wear the mask if you are alone. If you can't put on a mask (because of trouble breathing, for example), cover your coughs and sneezes in some other way. Try to stay at least 6 feet away from other people. This will help protect the people around you. Masks should not be placed on young children under age 2 years, anyone who has trouble breathing, or anyone who is not able to remove the mask without help. Cover your coughs and sneezes Cover your mouth and nose with a tissue when you cough or sneeze. Throw away used tissues in a lined trash can. Immediately wash your hands with soap and water for at least 20 seconds. If soap and water are not available, clean your hands with an alcohol-based hand sanitizer that contains at least 60% alcohol. Clean your hands often Wash your hands often with soap and water for at least 20 seconds. This is especially important after blowing your nose, coughing, or sneezing; going to the bathroom; and before eating or preparing food. Use hand sanitizer if soap and water are not available. Use an   alcohol-based hand sanitizer with at least 60% alcohol, covering all surfaces of your hands and rubbing them together until they feel dry. Soap and water are the best option, especially if hands are visibly dirty. Avoid touching your eyes, nose, and mouth with unwashed hands. Handwashing Tips Avoid sharing personal household items Do not share dishes, drinking glasses, cups, eating utensils, towels, or bedding with other people in your home. Wash these items thoroughly after using them with soap and water or put in the dishwasher. Clean surfaces in your home regularly Clean and disinfect high-touch surfaces (for example, doorknobs, tables, handles, light switches, and countertops) in your "sick room" and bathroom. In shared spaces, you should clean and disinfect surfaces and items after each use by the person who is  ill. If you are sick and cannot clean, a caregiver or other person should only clean and disinfect the area around you (such as your bedroom and bathroom) on an as needed basis. Your caregiver/other person should wait as long as possible (at least several hours) and wear a mask before entering, cleaning, and disinfecting shared spaces that you use. Clean and disinfect areas that may have blood, stool, or body fluids on them. Use household cleaners and disinfectants. Clean visible dirty surfaces with household cleaners containing soap or detergent. Then, use a household disinfectant. Use a product from EPA's List N: Disinfectants for Coronavirus (COVID-19). Be sure to follow the instructions on the label to ensure safe and effective use of the product. Many products recommend keeping the surface wet with a disinfectant for a certain period of time (look at "contact time" on the product label). You may also need to wear personal protective equipment, such as gloves, depending on the directions on the product label. Immediately after disinfecting, wash your hands with soap and water for 20 seconds. For completed guidance on cleaning and disinfecting your home, visit Complete Disinfection Guidance. Take steps to improve ventilation at home Improve ventilation (air flow) at home to help prevent from spreading COVID-19 to other people in your household. Clear out COVID-19 virus particles in the air by opening windows, using air filters, and turning on fans in your home. Use this interactive tool to learn how to improve air flow in your home. When you can be around others after being sick with COVID-19 Deciding when you can be around others is different for different situations. Find out when you can safely end home isolation. For any additional questions about your care, contact your healthcare provider or state or local health department. 01/05/2021 Content source: National Center for Immunization and  Respiratory Diseases (NCIRD), Division of Viral Diseases This information is not intended to replace advice given to you by your health care provider. Make sure you discuss any questions you have with your health care provider. Document Revised: 02/18/2021 Document Reviewed: 02/18/2021 Elsevier Patient Education  2022 Elsevier Inc.    

## 2022-02-08 ENCOUNTER — Ambulatory Visit
Admission: RE | Admit: 2022-02-08 | Discharge: 2022-02-08 | Disposition: A | Discharge status: Home / Self Care | Payer: BC Managed Care – PPO | Source: Ambulatory Visit | Attending: Orthopaedic Surgery | Admitting: Orthopaedic Surgery

## 2022-02-08 ENCOUNTER — Other Ambulatory Visit: Payer: Self-pay | Admitting: Orthopaedic Surgery

## 2022-02-08 DIAGNOSIS — M25571 Pain in right ankle and joints of right foot: Secondary | ICD-10-CM

## 2022-02-08 DIAGNOSIS — S8261XA Displaced fracture of lateral malleolus of right fibula, initial encounter for closed fracture: Secondary | ICD-10-CM | POA: Insufficient documentation

## 2022-02-09 ENCOUNTER — Ambulatory Visit
Admission: RE | Admit: 2022-02-09 | Discharge: 2022-02-09 | Disposition: A | Discharge status: Home / Self Care | Payer: BC Managed Care – PPO | Source: Ambulatory Visit | Attending: Orthopaedic Surgery | Admitting: Orthopaedic Surgery

## 2022-02-09 ENCOUNTER — Other Ambulatory Visit: Payer: Self-pay | Admitting: Orthopaedic Surgery

## 2022-02-09 DIAGNOSIS — M25571 Pain in right ankle and joints of right foot: Secondary | ICD-10-CM

## 2022-02-10 ENCOUNTER — Telehealth: Payer: BC Managed Care – PPO | Admitting: Family Medicine

## 2022-02-10 DIAGNOSIS — J40 Bronchitis, not specified as acute or chronic: Secondary | ICD-10-CM | POA: Diagnosis not present

## 2022-02-10 MED ORDER — PREDNISONE 10 MG (21) PO TBPK: ORAL_TABLET | ORAL | 0 refills | Status: DC

## 2022-02-10 MED ORDER — PROMETHAZINE-DM 6.25-15 MG/5ML PO SYRP: 2.5000 mL | ORAL_SOLUTION | Freq: Four times a day (QID) | ORAL | 0 refills | Status: DC | PRN

## 2022-02-10 NOTE — Progress Notes (Signed)
?Virtual Visit Consent  ? ?[name], you are scheduled for a virtual visit with a [facility] provider today.   ?  ?Just as with appointments in the office, your consent must be obtained to participate.  Your consent will be active for this visit and any virtual visit you may have with one of our providers in the next 365 days.   ?  ?If you have a MyChart account, a copy of this consent can be sent to you electronically.  All virtual visits are billed to your insurance company just like a traditional visit in the office.   ? ?As this is a virtual visit, video technology does not allow for your provider to perform a traditional examination.  This may limit your provider's ability to fully assess your condition.  If your provider identifies any concerns that need to be evaluated in person or the need to arrange testing (such as labs, EKG, etc.), we will make arrangements to do so.   ?  ?Although advances in technology are sophisticated, we cannot ensure that it will always work on either your end or our end.  If the connection with a video visit is poor, the visit may have to be switched to a telephone visit.  With either a video or telephone visit, we are not always able to ensure that we have a secure connection.    ? ?Also, by engaging in this virtual visit, you consent to the provision of healthcare. Additionally, you authorize for your insurance to be billed (if applicable) for the services provided during this visit.  ? ?I need to obtain your verbal consent now.   Are you willing to proceed with your visit today?  ?  ?Jenny Bailey has provided verbal consent on 02/10/2022 for a virtual visit (video or telephone). ?  ?[name], NP  ? ?Date: 02/10/2022 12:13 PM ? ? ?Virtual Visit via Video Note  ? ?[name], connected with  Jenny Bailey  ([id number], [birth year]) on 02/10/22 at 12:15 PM EDT by a video-enabled telemedicine application and verified that I am speaking with the correct person using two  identifiers. ? ?Location: ?Patient: Virtual Visit Location Patient: Home ?Provider: Virtual Visit Location Provider: Home Office ?  ?I discussed the limitations of evaluation and management by telemedicine and the availability of in person appointments. The patient expressed understanding and agreed to proceed.   ? ?History of Present Illness: ?Jenny Bailey is a 56 y.o. who identifies as a female who was assigned female at birth, and is being seen today for cough. ? ?HPI: Cough ?This is a new problem. The current episode started in the past 7 days. The problem has been gradually worsening. The problem occurs constantly. The cough is Non-productive. Associated symptoms include rhinorrhea and wheezing. Pertinent negatives include no chest pain, ear congestion, ear pain, fever, headaches, nasal congestion, sore throat or shortness of breath. Exacerbated by: recent travel- out of country- She has tried nothing for the symptoms. The treatment provided no relief.   ?Problems:  ?Patient Active Problem List  ? Diagnosis Date Noted  ? Arthritis, rheumatoid ([place]) 06/16/2015  ? Post-menopause on HRT (hormone replacement therapy) 06/16/2015  ? Anxiety and depression 06/16/2015  ?  ?Allergies: No Known Allergies ?Medications:  ?Current Outpatient Medications:  ?  busPIRone (BUSPAR) 15 MG tablet, Take 15 mg by mouth., Disp: , Rfl:  ?  cholecalciferol (VITAMIN D) 1000 UNITS tablet, Take 1,000 Units by mouth daily., Disp: , Rfl:  ?  estradiol (ESTRACE) 1 MG tablet, Take 1 tablet (1 mg total) by mouth daily., Disp: 90 tablet, Rfl: 4 ?  fluticasone (FLONASE) 50 MCG/ACT nasal spray, Place 1 spray into both nostrils daily., Disp: , Rfl:  ?  folic acid (FOLVITE) 1 MG tablet, Take 1 mg by mouth., Disp: , Rfl:  ?  HUMIRA PEN 40 MG/0.4ML PNKT, , Disp: , Rfl:  ?  hydroxychloroquine (PLAQUENIL) 200 MG tablet, Take 400 mg by mouth daily. , Disp: , Rfl:  ?  lamoTRIgine (LAMICTAL) 150 MG tablet, Take 1 tablet by mouth daily., Disp: , Rfl:  ?   LORazepam (ATIVAN) 0.5 MG tablet, Take 0.5 mg by mouth as needed., Disp: , Rfl:  ?  meloxicam (MOBIC) 15 MG tablet, Take 15 mg by mouth daily., Disp: , Rfl:  ?  methotrexate (RHEUMATREX) 2.5 MG tablet, Take 25 mg by mouth once a week. Every Sunday.  Takes 10 of the 2.5 mg tablets to = 25 mg dose, Disp: , Rfl:  ?  Multiple Vitamin (MULTIVITAMIN) tablet, Take 1 tablet by mouth daily., Disp: , Rfl:  ?  sertraline (ZOLOFT) 50 MG tablet, Take 150 mg by mouth daily. 3 tabs daily, Disp: , Rfl:  ?  traMADol (ULTRAM) 50 MG tablet, Take by mouth as needed., Disp: , Rfl:  ? ?Observations/Objective: ?Patient is well-developed, well-nourished in no acute distress.  ?Resting comfortably  at home.  ?Head is normocephalic, atraumatic.  ?No labored breathing.  ?Speech is clear and coherent with logical content.  ?Patient is alert and oriented at baseline.  ? ? ?Assessment and Plan: ?1. Bronchitis ? ?- promethazine-dextromethorphan (PROMETHAZINE-DM) 6.25-15 MG/5ML syrup; Take 2.5 mLs by mouth 4 (four) times daily as needed for cough.  Dispense: 118 mL; Refill: 0 ?- predniSONE (STERAPRED UNI-PAK 21 TAB) 10 MG (21) TBPK tablet; Take as directed  Dispense: 21 tablet; Refill: 0 ? ?-s&s consistent with bronchitis- viral related most likely given recent air travel, advised to test for covid as well  ? ?-tx as per above and OTC measures  ?-info on AVS  ? ? Reviewed side effects, risks and benefits of medication.   ? ?Patient acknowledged agreement and understanding of the plan.  ? ? ? ?Follow Up Instructions: ?I discussed the assessment and treatment plan with the patient. The patient was provided an opportunity to ask questions and all were answered. The patient agreed with the plan and demonstrated an understanding of the instructions.  A copy of instructions were sent to the patient via MyChart unless otherwise noted below.  ? ? ? ?The patient was advised to call back or seek an in-person evaluation if the symptoms worsen or if the  condition fails to improve as anticipated. ? ?Time:  ?I spent 15 minutes with the patient via telehealth technology discussing the above problems/concerns.   ? ?[name], NP ? ?

## 2022-02-10 NOTE — Patient Instructions (Signed)
Take COVID test to rule out since you recently traveled. ? ?I hope you feel better soon.  ? ?Acute Bronchitis, Adult ? ?Acute bronchitis is when air tubes in the lungs (bronchi) suddenly get swollen. The condition can make it hard for you to breathe. In adults, acute bronchitis usually goes away within 2 weeks. A cough caused by bronchitis may last up to 3 weeks. Smoking, allergies, and asthma can make the condition worse. ?What are the causes? ?Germs that cause cold and flu (viruses). The most common cause of this condition is the virus that causes the common cold. ?Bacteria. ?Substances that bother (irritate) the lungs, including: ?Smoke from cigarettes and other types of tobacco. ?Dust and pollen. ?Fumes from chemicals, gases, or burned fuel. ?Indoor or outdoor air pollution. ?What increases the risk? ?A weak body's defense system. This is also called the immune system. ?Any condition that affects your lungs and breathing, such as asthma. ?What are the signs or symptoms? ?A cough. ?Coughing up clear, yellow, or green mucus. ?Making high-pitched whistling sounds when you breathe, most often when you breathe out (wheezing). ?Runny or stuffy nose. ?Having too much mucus in your lungs (chest congestion). ?Shortness of breath. ?Body aches. ?A sore throat. ?How is this treated? ?Acute bronchitis may go away over time without treatment. Your doctor may tell you to: ?Drink more fluids. This will help thin your mucus so it is easier to cough up. ?Use a device that gets medicine into your lungs (inhaler). ?Use a vaporizer or a humidifier. These are machines that add water to the air. This helps with coughing and poor breathing. ?Take a medicine that thins mucus and helps clear it from your lungs. ?Take a medicine that prevents or stops coughing. ?It is not common to take an antibiotic medicine for this condition. ?Follow these instructions at home: ? ?Take over-the-counter and prescription medicines only as told by your  doctor. ?Use an inhaler, vaporizer, or humidifier as told by your doctor. ?Take two teaspoons (10 mL) of honey at bedtime. This helps lessen your coughing at night. ?Drink enough fluid to keep your pee (urine) pale yellow. ?Do not smoke or use any products that contain nicotine or tobacco. If you need help quitting, ask your doctor. ?Get a lot of rest. ?Return to your normal activities when your doctor says that it is safe. ?Keep all follow-up visits. ?How is this prevented? ? ?Wash your hands often with soap and water for at least 20 seconds. If you cannot use soap and water, use hand sanitizer. ?Avoid contact with people who have cold symptoms. ?Try not to touch your mouth, nose, or eyes with your hands. ?Avoid breathing in smoke or chemical fumes. ?Make sure to get the flu shot every year. ?Contact a doctor if: ?Your symptoms do not get better in 2 weeks. ?You have trouble coughing up the mucus. ?Your cough keeps you awake at night. ?You have a fever. ?Get help right away if: ?You cough up blood. ?You have chest pain. ?You have very bad shortness of breath. ?You faint or keep feeling like you are going to faint. ?You have a very bad headache. ?Your fever or chills get worse. ?These symptoms may be an emergency. Get help right away. Call your local emergency services (911 in the [place] ?Do not wait to see if the symptoms will go away. ?Do not drive yourself to the hospital. ?Summary ?Acute bronchitis is when air tubes in the lungs (bronchi) suddenly get swollen. In adults, acute bronchitis  usually goes away within 2 weeks. ?Drink more fluids. This will help thin your mucus so it is easier to cough up. ?Take over-the-counter and prescription medicines only as told by your doctor. ?Contact a doctor if your symptoms do not improve after 2 weeks of treatment. ?This information is not intended to replace advice given to you by your health care provider. Make sure you discuss any questions you have with your health care  provider. ?Document Revised: 02/03/2021 Document Reviewed: 02/03/2021 ?Elsevier Patient Education ? [place]. ? ?

## 2022-09-23 ENCOUNTER — Encounter: Payer: Self-pay | Admitting: Obstetrics and Gynecology

## 2022-11-28 DIAGNOSIS — M25551 Pain in right hip: Secondary | ICD-10-CM | POA: Insufficient documentation

## 2022-12-01 ENCOUNTER — Other Ambulatory Visit: Payer: Self-pay | Admitting: Obstetrics and Gynecology

## 2022-12-01 DIAGNOSIS — N951 Menopausal and female climacteric states: Secondary | ICD-10-CM

## 2022-12-01 NOTE — Telephone Encounter (Signed)
Last AEX 09/23/21.  Not yet scheduled.  MMG 09/21/2022 neg  Message sent to appt desk to pls arrange AEX and let me know so we can forward Rx request to Dr. Quincy Simmonds.

## 2022-12-01 NOTE — Telephone Encounter (Signed)
Lorel Monaco, RMA Left message for patient to call and schedule appointment.

## 2022-12-02 ENCOUNTER — Other Ambulatory Visit: Payer: Self-pay | Admitting: *Deleted

## 2022-12-02 DIAGNOSIS — N951 Menopausal and female climacteric states: Secondary | ICD-10-CM

## 2022-12-02 NOTE — Telephone Encounter (Signed)
RX request Estrace 1 mg  Last annual 09/23/2021 Scheduled annual 01/29/23 Last mammogram 09/21/2022 Routing to Provider for approval/denial

## 2022-12-04 MED ORDER — ESTRADIOL 1 MG PO TABS: 1.0000 mg | ORAL_TABLET | Freq: Every day | ORAL | 0 refills | Status: DC

## 2022-12-05 NOTE — Telephone Encounter (Signed)
AEX scheduled for 02/01/23.

## 2022-12-17 ENCOUNTER — Other Ambulatory Visit: Payer: Self-pay | Admitting: Obstetrics and Gynecology

## 2022-12-17 DIAGNOSIS — N951 Menopausal and female climacteric states: Secondary | ICD-10-CM

## 2022-12-19 NOTE — Telephone Encounter (Signed)
Rx sent on 12/04/2022 for #90. Pt has upcoming annual exam on 02/01/2023. Will refuse for now and note that pt will receive refills then if appropriate. Will route to provider for final review.

## 2022-12-20 ENCOUNTER — Emergency Department (HOSPITAL_COMMUNITY): Payer: BC Managed Care – PPO

## 2022-12-20 ENCOUNTER — Encounter (HOSPITAL_COMMUNITY): Payer: Self-pay

## 2022-12-20 ENCOUNTER — Emergency Department (HOSPITAL_COMMUNITY)
Admission: EM | Admit: 2022-12-20 | Discharge: 2022-12-20 | Disposition: A | Discharge status: Home / Self Care | Payer: BC Managed Care – PPO | Attending: Emergency Medicine | Admitting: Emergency Medicine

## 2022-12-20 ENCOUNTER — Other Ambulatory Visit: Payer: Self-pay

## 2022-12-20 DIAGNOSIS — M25552 Pain in left hip: Secondary | ICD-10-CM | POA: Insufficient documentation

## 2022-12-20 MED ORDER — ONDANSETRON HCL 4 MG/2ML IJ SOLN
4.0000 mg | Freq: Once | INTRAMUSCULAR | Status: AC
  Administered 2022-12-20: 4 mg via INTRAVENOUS
  Filled 2022-12-20: qty 2

## 2022-12-20 MED ORDER — HYDROCODONE-ACETAMINOPHEN 5-325 MG PO TABS: 1.0000 | ORAL_TABLET | ORAL | 0 refills | Status: DC | PRN

## 2022-12-20 MED ORDER — MORPHINE SULFATE (PF) 4 MG/ML IV SOLN
4.0000 mg | Freq: Once | INTRAVENOUS | Status: AC
  Administered 2022-12-20: 4 mg via INTRAVENOUS
  Filled 2022-12-20: qty 1

## 2022-12-20 MED ORDER — METHOCARBAMOL 500 MG PO TABS: 500.0000 mg | ORAL_TABLET | Freq: Two times a day (BID) | ORAL | 0 refills | Status: DC | PRN

## 2022-12-20 MED ORDER — HYDROCODONE-ACETAMINOPHEN 5-325 MG PO TABS
1.0000 | ORAL_TABLET | Freq: Once | ORAL | Status: AC
  Administered 2022-12-20: 1 via ORAL
  Filled 2022-12-20: qty 1

## 2022-12-20 NOTE — ED Triage Notes (Signed)
From home. 1 week of increasing non-traumatic left hip pain, worse with movement and bearing weight. Denies GI/GU complaints. No numbness/tingling. Using walker to get around. Hx of right hip injury.

## 2022-12-20 NOTE — ED Provider Notes (Signed)
[facility] Provider Note   CSN: [id number] Arrival date & time: 12/20/22  [id number]     History  Chief Complaint  Patient presents with   Hip Pain         Jenny Bailey is a 57 y.o. female.  Pt is a 57 yo female with pmhx significant for RA, depression, and anxiety.  Pt said she injured her right hip in December.  She put off any eval for several months and recently saw [facility].  She had a MRI and was told on Friday, March 1, that she had a pelvis fracture, labral tear, and a ligamentous injury.  I can't see [facility] films on Epic.  Pt has been walking with a walker since then.  Today, she woke up and had severe left hip pain.  She thinks she's been putting more weight on her left leg since she injured her right.  Pt denies any new fall.  She does have tramadol and flexeril that she's been taking for pain.  She was unable to bear weight.       Home Medications Prior to Admission medications   Medication Sig Start Date End Date Taking? Authorizing Provider  HYDROcodone-acetaminophen (NORCO/VICODIN) 5-325 MG tablet Take 1 tablet by mouth every 4 (four) hours as needed. 12/20/22  Yes Isla Pence, MD  methocarbamol (ROBAXIN) 500 MG tablet Take 1 tablet (500 mg total) by mouth 2 (two) times daily as needed for muscle spasms. 12/20/22  Yes Isla Pence, MD  busPIRone (BUSPAR) 15 MG tablet Take 15 mg by mouth. 03/26/13   [provider]  cholecalciferol (VITAMIN D) 1000 UNITS tablet Take 1,000 Units by mouth daily.    [provider]  estradiol (ESTRACE) 1 MG tablet Take 1 tablet (1 mg total) by mouth daily. 12/04/22   Nunzio Cobbs, MD  fluticasone (FLONASE) 50 MCG/ACT nasal spray Place 1 spray into both nostrils daily. 11/01/13   [provider]  folic acid (FOLVITE) 1 MG tablet Take 1 mg by mouth. 03/13/13   [provider]  HUMIRA PEN 40 MG/0.4ML PNKT  07/16/18   [provider]   hydroxychloroquine (PLAQUENIL) 200 MG tablet Take 400 mg by mouth daily.  11/08/13   [provider]  lamoTRIgine (LAMICTAL) 150 MG tablet Take 1 tablet by mouth daily. 05/04/15   [provider]  LORazepam (ATIVAN) 0.5 MG tablet Take 0.5 mg by mouth as needed. 11/12/13   [provider]  meloxicam (MOBIC) 15 MG tablet Take 15 mg by mouth daily. 08/22/14   [provider]  methotrexate (RHEUMATREX) 2.5 MG tablet Take 25 mg by mouth once a week. Every Sunday.  Takes 10 of the 2.5 mg tablets to = 25 mg dose 05/07/13   [provider]  Multiple Vitamin (MULTIVITAMIN) tablet Take 1 tablet by mouth daily.    [provider]  predniSONE (STERAPRED UNI-PAK 21 TAB) 10 MG (21) TBPK tablet Take as directed 02/10/22   [name], NP  promethazine-dextromethorphan (PROMETHAZINE-DM) 6.25-15 MG/5ML syrup Take 2.5 mLs by mouth 4 (four) times daily as needed for cough. 02/10/22   [name], NP  sertraline (ZOLOFT) 50 MG tablet Take 150 mg by mouth daily. 3 tabs daily    [provider]  traMADol (ULTRAM) 50 MG tablet Take by mouth as needed.    [provider]      Allergies    Patient has no known allergies.  Review of Systems   Review of Systems  Musculoskeletal:        Left hip pain  All other systems reviewed and are negative.   Physical Exam Updated Vital Signs BP 127/63   Pulse 93   Temp 98 F (36.7 C) (Oral)   Resp 18   Ht '5\' 11"'$  (1.803 m)   Wt 104.3 kg   LMP 02/10/2014 (Exact Date)   SpO2 94%   BMI 32.08 kg/m  Physical Exam Vitals and nursing note reviewed.  Constitutional:      Appearance: Normal appearance. She is obese.  HENT:     Head: Normocephalic and atraumatic.     Right Ear: External ear normal.     Left Ear: External ear normal.     Nose: Nose normal.     Mouth/Throat:     Mouth: Mucous membranes are dry.  Eyes:     Extraocular Movements: Extraocular movements intact.      Conjunctiva/sclera: Conjunctivae normal.     Pupils: Pupils are equal, round, and reactive to light.  Cardiovascular:     Rate and Rhythm: Normal rate and regular rhythm.     Pulses: Normal pulses.     Heart sounds: Normal heart sounds.  Pulmonary:     Effort: Pulmonary effort is normal.     Breath sounds: Normal breath sounds.  Abdominal:     General: Abdomen is flat. Bowel sounds are normal.     Palpations: Abdomen is soft.  Musculoskeletal:     Cervical back: Normal range of motion and neck supple.       Legs:  Skin:    General: Skin is warm.     Capillary Refill: Capillary refill takes less than 2 seconds.  Neurological:     General: No focal deficit present.     Mental Status: She is alert and oriented to person, place, and time.  Psychiatric:        Mood and Affect: Mood normal.        Behavior: Behavior normal.     ED Results / Procedures / Treatments   Labs (all labs ordered are listed, but only abnormal results are displayed) Labs Reviewed - No data to display  EKG None  Radiology DG Hip Unilat W or Wo Pelvis 2-3 Views Left  Result Date: 12/20/2022 CLINICAL DATA:  57 year old female with left hip pain. EXAM: DG HIP (WITH OR WITHOUT PELVIS) 2-3V LEFT COMPARISON:  None Available. FINDINGS: Portable AP supine and frogleg lateral views at 0946 hours. Femoral heads are normally located. The left hemipelvis appears intact with bone mineralization within normal limits. However, the junction of the right inferior and superior pubic rami lateral to the right pubic symphysis appears fractured and partially eroded. Grossly intact proximal right femur. Symphysis and SI joints appear to remain within normal limits. No other bone erosion identified. Proximal left femur appears intact and within normal limits. Negative visible bowel gas pattern.  Incidental pelvis phleboliths. IMPRESSION: 1. Indeterminate fracture and erosion at the junction of the right pubic rami lateral to the  symphysis. 2. But the left hemipelvis and proximal left femur appear intact. No other osseous abnormality identified. Electronically Signed   By: Genevie Ann M.D.   On: 12/20/2022 09:59    Procedures Procedures    Medications Ordered in ED Medications  HYDROcodone-acetaminophen (NORCO/VICODIN) 5-325 MG per tablet 1 tablet (has no administration in time range)  morphine (PF) 4 MG/ML injection 4 mg (4 mg Intravenous Given 12/20/22 0952)  ondansetron [facility]) injection 4 mg (4 mg Intravenous Given 12/20/22 [id number])    ED Course/ Medical Decision Making/ A&P                             Medical Decision Making Amount and/or Complexity of Data Reviewed Radiology: ordered.  Risk Prescription drug management.   This patient presents to the ED for concern of left hip pain, this involves an extensive number of treatment options, and is a complaint that carries with it a high risk of complications and morbidity.  The differential diagnosis includes fx, muscular strain, muscular spasm   Co morbidities that complicate the patient evaluation  RA, depression, and anxiety   Additional history obtained:  Additional history obtained from epic chart review External records from outside source obtained and reviewed including EMS report Imaging Studies ordered:  I ordered imaging studies including hip  I independently visualized and interpreted imaging which showed  Indeterminate fracture and erosion at the junction of the right  pubic rami lateral to the symphysis.  2. But the left hemipelvis and proximal left femur appear intact. No  other osseous abnormality identified.   I agree with the radiologist interpretation   Cardiac Monitoring:  The patient was maintained on a cardiac monitor.  I personally viewed and interpreted the cardiac monitored which showed an underlying rhythm of: nsr   Medicines ordered and prescription drug management:  I ordered medication including morphine  for pain   Reevaluation of the patient after these medicines showed that the patient improved I have reviewed the patients home medicines and have made adjustments as needed   Test Considered:  xr   Critical Interventions:  Pain control   Problem List / ED Course:  L hip pain:  likely from putting more pressure on the left side due to right pelvis fx.  Xray neg for fx in the left hip.  Pt given a short course of lortab and robaxin and she is to hold tramadol and flexeril while on those.  She is to f/u with ortho/PT and is to return if worse.   Reevaluation:  After the interventions noted above, I reevaluated the patient and found that they have :improved   Social Determinants of Health:  Lives at home   Dispostion:  After consideration of the diagnostic results and the patients response to treatment, I feel that the patent would benefit from discharge with outpatient f/u.          Final Clinical Impression(s) / ED Diagnoses Final diagnoses:  Acute pain of left hip    Rx / DC Orders ED Discharge Orders          Ordered    HYDROcodone-acetaminophen (NORCO/VICODIN) 5-325 MG tablet  Every 4 hours PRN        12/20/22 1106    methocarbamol (ROBAXIN) 500 MG tablet  2 times daily PRN        12/20/22 1106              Isla Pence, MD 12/20/22 1109

## 2022-12-20 NOTE — ED Notes (Signed)
ED Provider at bedside. 

## 2022-12-20 NOTE — ED Notes (Signed)
XRAY tech in room

## 2022-12-20 NOTE — ED Notes (Signed)
Pt ambulated steady gait with a walker to bathroom.

## 2023-01-05 ENCOUNTER — Ambulatory Visit (HOSPITAL_BASED_OUTPATIENT_CLINIC_OR_DEPARTMENT_OTHER): Payer: BC Managed Care – PPO | Attending: Family Medicine | Admitting: Physical Therapy

## 2023-01-05 DIAGNOSIS — M25651 Stiffness of right hip, not elsewhere classified: Secondary | ICD-10-CM | POA: Diagnosis not present

## 2023-01-05 DIAGNOSIS — M25551 Pain in right hip: Secondary | ICD-10-CM | POA: Insufficient documentation

## 2023-01-05 DIAGNOSIS — R2689 Other abnormalities of gait and mobility: Secondary | ICD-10-CM | POA: Diagnosis not present

## 2023-01-05 DIAGNOSIS — M25552 Pain in left hip: Secondary | ICD-10-CM | POA: Diagnosis present

## 2023-01-05 NOTE — Therapy (Signed)
OUTPATIENT PHYSICAL THERAPY THORACOLUMBAR Treatment    Patient Name: Jenny Bailey MRN: [id number] DOB:10-22-1965, 57 y.o., female Today's Date: 01/06/2023  END OF SESSION:  PT End of Session - 01/06/23 0704     Visit Number 1    Number of Visits 12    Date for PT Re-Evaluation 02/17/23    PT Start Time [id number]    PT Stop Time [id number]    PT Time Calculation (min) 43 min    Activity Tolerance Patient tolerated treatment well    Behavior During Therapy [facility] for tasks assessed/performed             Past Medical History:  Diagnosis Date   Anxiety 1999   Depression 1999   RA (rheumatoid arthritis) ([place]) 2013   hands, elbows, knees   Seasonal allergies    Shingles    Past Surgical History:  Procedure Laterality Date   CYSTOSCOPY N/A 02/25/2014   Procedure: CYSTOSCOPY;  Surgeon: Jamey Reas de Berton Lan, MD;  Location: [facility];  Service: Gynecology;  Laterality: N/A;   ROBOTIC ASSISTED TOTAL HYSTERECTOMY Bilateral 02/25/2014   Procedure: ROBOTIC ASSISTED TOTAL HYSTERECTOMY WITH BILATERAL SALPINGOOPHARECTOMY;  Surgeon: Jamey Reas de Berton Lan, MD;  Location: [facility];  Service: Gynecology;  Laterality: Bilateral;   WISDOM TOOTH EXTRACTION  2007   Patient Active Problem List   Diagnosis Date Noted   Arthritis, rheumatoid ([place]) 06/16/2015   Post-menopause on HRT (hormone replacement therapy) 06/16/2015   Anxiety and depression 06/16/2015    PCP: Jonathon Jordan MD  REFERRING PROVIDER: Jonathon Jordan MD  REFERRING DIAG: Pelvic fx, labrum tear,   Rationale for Evaluation and Treatment: Rehabilitation  THERAPY DIAG:  No diagnosis found.  ONSET DATE: December 2023  SUBJECTIVE:                                                                                                                                                                                           SUBJECTIVE STATEMENT: Pt is a 57 y/o female. Pt was bowling in December with some shoes that were a  bit too big and was bowling with a heavy size ball. She reports an insidious onset of hip pain.  Patient was seeing a friend of her who is a PT, was doing some rehab exercises and had a significant increase in pain . In January she had to use a single point cane to ambulate. The injury is on the right side but is feeling it on the left side. She has since progressed to a walker. On 3/5 her pain increased to the point on the left where she had to  go to the ED. Marland Kitchen She saw an [facility] at [facility]. She was found to have a pelvic ring fracture and a right hip labral tear. No imaging was done of the left side.   PERTINENT HISTORY:  RA, depression, anxiety, shingles,   PAIN:  Are you having pain? Yes: NPRS scale: 3/10 Pain location: left side of the hip Pain description: aching  Aggravating factors: lifting  Relieving factors: medication, flexoril, but has also used a heating pad   PRECAUTIONS: None  WEIGHT BEARING RESTRICTIONS: No  FALLS:  Has patient fallen in last 6 months? No  LIVING ENVIRONMENT: Has ramp in the back of her house.   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 Indeterminate fracture and erosion at the junction of the right pubic rami lateral to the symphysis. 2. But the left hemipelvis and proximal left femur appear intact. No other osseous abnormality identified.    PATIENT SURVEYS:  FOTO    SCREENING FOR RED FLAGS: Bowel or bladder incontinence: No Spinal tumors: No Cauda equina syndrome: No Compression fracture: No Abdominal aneurysm: No  COGNITION: Overall cognitive status: Within functional limits for tasks assessed     SENSATION: WFL  MUSCLE LENGTH:   POSTURE: rounded shoulders and forward head  PALPATION: Upper gluteal  region on right side  Worse on the left upper gluteal region Pain bilateral IT band   LUMBAR ROM:   AROM eval  Flexion 90 without pain   Extension No pain   Right lateral flexion   Left lateral flexion   Right rotation Feels a little pull on lateral hip  Left rotation No pain    (Blank rows = not tested)  LOWER EXTREMITY ROM:     Passive  Right eval Left eval  Hip flexion 90 degrees mild pain    Hip extension    Hip abduction    Hip adduction    Hip internal rotation Mild pain    Hip external rotation Full pain free    Knee flexion    Knee extension    Ankle dorsiflexion    Ankle plantarflexion    Ankle inversion    Ankle eversion     (Blank rows = not tested)  LOWER EXTREMITY MMT:    MMT Right eval Left eval  Hip flexion 7 11.4  Hip extension    Hip abduction 13.2 17.4  Hip adduction    Hip internal rotation    Hip external rotation    Knee flexion    Knee extension 21 17.8  Ankle dorsiflexion    Ankle plantarflexion    Ankle inversion    Ankle eversion     (Blank rows = not tested)    GAIT: Without walker: decreased weight bearing and lateral shift away from right side then back away from left.  TODAY'S TREATMENT:  DATE: 01/05/2023   PATIENT EDUCATION:  Education details: HEP, symptom management; POC  Person educated: Patient Education method: Explanation, Demonstration, Tactile cues, Verbal cues, and Handouts Education comprehension: verbalized understanding, returned demonstration, verbal cues required, tactile cues required, and needs further education  HOME EXERCISE PROGRAM:   ASSESSMENT:  CLINICAL IMPRESSION: Patient is a 57 year old female who presents to physical therapy with bilateral hip pain.  She was recently found to have an insidious pelvic ring fracture with no prior history of fall.  She was also per MRI  found to have a right hip labral tear.  Most of her pain has been focused on the left side.  On the right side she has limited hip flexion.  She also has bilateral strength deficits on the right compared to left.  She has bilateral strength deficits overall in the lower extremities.  She has full range of motion on the left side.  She has significant spasming bilateral.  Most of her pain recently has been focused on the left side.  It seems to be compensatory for right-sided issues.  She is currently using a walker for primary mobility.  Past medical history includes RA.  She would benefit from skilled therapy to return to prior level including walking in the community.  OBJECTIVE IMPAIRMENTS: Abnormal gait, decreased activity tolerance, decreased endurance, difficulty walking, decreased ROM, decreased strength, increased muscle spasms, and pain.   ACTIVITY LIMITATIONS: carrying, lifting, bending, standing, squatting, sleeping, stairs, and locomotion level  PARTICIPATION LIMITATIONS: meal prep, cleaning, laundry, driving, shopping, community activity, and occupation  PERSONAL FACTORS: 1-2 comorbidities: RA   are also affecting patient's functional outcome.   REHAB POTENTIAL: Good  CLINICAL DECISION MAKING: Evolving/moderate complexity progressive loss of mobility   EVALUATION COMPLEXITY: Moderate   GOALS: Goals reviewed with patient? Yes  SHORT TERM GOALS: Target date: 01/27/2023     Patient will ambulate 300 feet with single-point cane without increased pain in bilateral hip Baseline: Goal status: INITIAL  2.  Patient will increase gross bilateral lower extremity strength by 5 pounds Baseline:  Goal status: INITIAL  3.  Patient will report a 50% reduction in pain in left hip while standing and walking Baseline:  Goal status: INITIAL  4.  Patient will be independent with basic HEP Baseline:  Goal status: INITIAL    LONG TERM GOALS: Target date: 02/17/2023    Patient will  ambulate 2000 feet without self-report of increased bilateral hip pain without an assistive device Baseline:  Goal status: INITIAL  2.  Patient will stand for 30 minutes without report of increased bilateral hip pain in order to perform ADLs Baseline:  Goal status: INITIAL  3.  Patient will have a complete land and aquatic exercise program to promote increased strengthening and increased functional mobility Baseline:  Goal status: INITIAL   PLAN:  PT FREQUENCY: 1-2x/week  PT DURATION: 6 weeks  PLANNED INTERVENTIONS: Therapeutic exercises, Therapeutic activity, Neuromuscular re-education, Balance training, Gait training, Patient/Family education, Self Care, Joint mobilization, DME instructions, Cryotherapy, Moist heat, Ultrasound, and Manual therapy.  PLAN FOR NEXT SESSION: And water: Work on normalizing gait.  Patient has decreased weightbearing on right lower extremity and significant lateral shifting when not using a device.  Work on bilateral hip strengthening work on core stability and strengthening.  Land: Soft tissue mobilization to left and right hip.  Be aware of right hip labral tear and right pubic rami fracture.  Head and base supine core stability exercises including supine march, supine bridge, low range hip  abduction, posterior pelvic tilt.  Progressed to standing weightbearing exercises as tolerated   Carney Living, PT 01/06/2023, 11:04 AM

## 2023-01-06 ENCOUNTER — Encounter (HOSPITAL_BASED_OUTPATIENT_CLINIC_OR_DEPARTMENT_OTHER): Payer: Self-pay | Admitting: Physical Therapy

## 2023-01-12 NOTE — Therapy (Signed)
OUTPATIENT PHYSICAL THERAPY THORACOLUMBAR Treatment    Patient Name: Jenny Bailey MRN: [id number] DOB:December 08, 1965, 57 y.o., female Today's Date: 01/16/2023  END OF SESSION:  PT End of Session - 01/16/23 1122     Visit Number 2    Number of Visits 12    Date for PT Re-Evaluation 02/17/23    PT Start Time 1100    PT Stop Time 1138    PT Time Calculation (min) 38 min    Activity Tolerance No increased pain;Patient tolerated treatment well    Behavior During Therapy [facility] for tasks assessed/performed              Past Medical History:  Diagnosis Date   Anxiety 1999   Depression 1999   RA (rheumatoid arthritis) 2013   hands, elbows, knees   Seasonal allergies    Shingles    Past Surgical History:  Procedure Laterality Date   CYSTOSCOPY N/A 02/25/2014   Procedure: CYSTOSCOPY;  Surgeon: Jamey Reas de Berton Lan, MD;  Location: [facility];  Service: Gynecology;  Laterality: N/A;   ROBOTIC ASSISTED TOTAL HYSTERECTOMY Bilateral 02/25/2014   Procedure: ROBOTIC ASSISTED TOTAL HYSTERECTOMY WITH BILATERAL SALPINGOOPHARECTOMY;  Surgeon: Jamey Reas de Berton Lan, MD;  Location: [facility];  Service: Gynecology;  Laterality: Bilateral;   WISDOM TOOTH EXTRACTION  2007   Patient Active Problem List   Diagnosis Date Noted   Arthritis, rheumatoid 06/16/2015   Post-menopause on HRT (hormone replacement therapy) 06/16/2015   Anxiety and depression 06/16/2015    PCP: Jonathon Jordan MD  REFERRING PROVIDER: Jonathon Jordan MD  REFERRING DIAG: Pelvic fx, labrum tear,   Rationale for Evaluation and Treatment: Rehabilitation  THERAPY DIAG:  Stiffness of right hip, not elsewhere classified  Pain in right hip  Other abnormalities of gait and mobility  Pain in left hip  ONSET DATE: December 2023  SUBJECTIVE:                                                                                                                                                                                            SUBJECTIVE STATEMENT: Pt states that she is still having pain in her groin. 3-4/10 today.   PERTINENT HISTORY:  RA, depression, anxiety, shingles,   PAIN:  Are you having pain? Yes: NPRS scale: 3/10 Pain location: left side of the hip Pain description: aching  Aggravating factors: lifting  Relieving factors: medication, flexoril, but has also used a heating pad   PRECAUTIONS: None  WEIGHT BEARING RESTRICTIONS: No  FALLS:  Has patient fallen in last 6 months? No  LIVING ENVIRONMENT: Has ramp in the back  of her house.   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 Indeterminate fracture and erosion at the junction of the right pubic rami lateral to the symphysis. 2. But the left hemipelvis and proximal left femur appear intact. No other osseous abnormality identified.    PATIENT SURVEYS:  FOTO    SCREENING FOR RED FLAGS: Bowel or bladder incontinence: No Spinal tumors: No Cauda equina syndrome: No Compression fracture: No Abdominal aneurysm: No  COGNITION: Overall cognitive status: Within functional limits for tasks assessed     SENSATION: WFL  MUSCLE LENGTH:   POSTURE: rounded shoulders and forward head  PALPATION: Upper gluteal region on right side  Worse on the left upper gluteal region Pain bilateral IT band   LUMBAR ROM:   AROM eval  Flexion 90 without pain   Extension No pain   Right lateral flexion   Left lateral flexion   Right rotation Feels a little pull on lateral hip  Left rotation No pain    (Blank rows = not tested)  LOWER EXTREMITY ROM:     Passive  Right eval Left eval  Hip flexion 90 degrees mild pain    Hip extension    Hip abduction    Hip adduction    Hip internal rotation Mild  pain    Hip external rotation Full pain free    Knee flexion    Knee extension    Ankle dorsiflexion    Ankle plantarflexion    Ankle inversion    Ankle eversion     (Blank rows = not tested)  LOWER EXTREMITY MMT:    MMT Right eval Left eval  Hip flexion 7 11.4  Hip extension    Hip abduction 13.2 17.4  Hip adduction    Hip internal rotation    Hip external rotation    Knee flexion    Knee extension 21 17.8  Ankle dorsiflexion    Ankle plantarflexion    Ankle inversion    Ankle eversion     (Blank rows = not tested)    GAIT: Without walker: decreased weight bearing and lateral shift away from right side then back away from left.  TODAY'S TREATMENT:     Date 01/16/2023:  PPT  Figure 4 stretch 2x30, bilat  Bent knee fall outs, bilat  Adductor ball squeeze 5 sec hold Supine clams with RTB  LTR  Heel slides, bilat  Bridges as tolerated  Sit to stand from high surface with UE use as needed.  Seated heel raises  Seated LAQ, bilat                                                                                                                         DATE:  01/05/2023   PATIENT EDUCATION:  Education details: HEP, symptom management; POC  Person educated: Patient Education method: Explanation, Demonstration, Tactile cues, Verbal cues, and Handouts Education comprehension: verbalized understanding, returned demonstration, verbal cues required, tactile cues required, and needs further education  HOME EXERCISE PROGRAM: Access Code: [email or web address] URL: [email or web address] Date: 01/16/2023 Prepared by: Rudi Heap  Exercises - Supine Bridge  - 2 x daily - 7 x weekly - 2 sets - 10 reps - Supine Posterior Pelvic Tilt  - 2 x daily - 7 x weekly - 2 sets - 10 reps - Supine Lower Trunk Rotation  - 2 x daily - 7 x weekly - 2 sets - 10 reps - Supine Hip Adduction Isometric with Ball  - 2 x daily - 7 x weekly - 2 sets - 10 reps - Hooklying Clamshell with Resistance   - 2 x daily - 7 x weekly - 2 sets - 10 reps - Seated Long Arc Quad  - 2 x daily - 7 x weekly - 2 sets - 10 reps - Supine Heel Slide with Strap  - 2 x daily - 7 x weekly - 2 sets - 10 reps - Supine Figure 4 Piriformis Stretch  - 2 x daily - 7 x weekly - 2 sets - 10 reps - Sit to Stand  - 2 x daily - 7 x weekly - 2 sets - 10 reps  ASSESSMENT:  CLINICAL IMPRESSION: Patient returns to first f/u appt with continued groin pain. She requires cues for proper form with HEP today, but tolerated all exercises well. Pt limited with ROM due to pain and is unable to tolerate supine marches. Pt tolerated mobility and gentle strengthening exercises well today with intermittent fatigue and slight pain. Exercises modified as needed. Pt encouraged to continue with exercises at home as tolerated. Provided pt with HEP print out. Pt will continue to benefit from skilled PT to address continued deficits.   OBJECTIVE IMPAIRMENTS: Abnormal gait, decreased activity tolerance, decreased endurance, difficulty walking, decreased ROM, decreased strength, increased muscle spasms, and pain.   ACTIVITY LIMITATIONS: carrying, lifting, bending, standing, squatting, sleeping, stairs, and locomotion level  PARTICIPATION LIMITATIONS: meal prep, cleaning, laundry, driving, shopping, community activity, and occupation  PERSONAL FACTORS: 1-2 comorbidities: RA   are also affecting patient's functional outcome.   REHAB POTENTIAL: Good  CLINICAL DECISION MAKING: Evolving/moderate complexity progressive loss of mobility   EVALUATION COMPLEXITY: Moderate   GOALS: Goals reviewed with patient? Yes  SHORT TERM GOALS: Target date: 01/27/2023     Patient will ambulate 300 feet with single-point cane without increased pain in bilateral hip Baseline: Goal status: INITIAL  2.  Patient will increase gross bilateral lower extremity strength by 5 pounds Baseline:  Goal status: INITIAL  3.  Patient will report a 50% reduction in  pain in left hip while standing and walking Baseline:  Goal status: INITIAL  4.  Patient will be independent with basic HEP Baseline:  Goal status: INITIAL    LONG TERM GOALS: Target date: 02/17/2023    Patient will ambulate 2000 feet without self-report of increased bilateral hip pain without an assistive device Baseline:  Goal status: INITIAL  2.  Patient will stand for 30 minutes without report of increased bilateral hip pain in order to perform ADLs Baseline:  Goal status: INITIAL  3.  Patient will have a complete land and aquatic exercise program to promote increased strengthening and increased functional mobility Baseline:  Goal status: INITIAL   PLAN:  PT FREQUENCY:  1-2x/week  PT DURATION: 6 weeks  PLANNED INTERVENTIONS: Therapeutic exercises, Therapeutic activity, Neuromuscular re-education, Balance training, Gait training, Patient/Family education, Self Care, Joint mobilization, DME instructions, Cryotherapy, Moist heat, Ultrasound, and Manual therapy.  PLAN FOR NEXT SESSION: And water: Work on normalizing gait.  Patient has decreased weightbearing on right lower extremity and significant lateral shifting when not using a device.  Work on bilateral hip strengthening work on core stability and strengthening.  Land: Soft tissue mobilization to left and right hip.  Be aware of right hip labral tear and right pubic rami fracture.  Head and base supine core stability exercises including supine march, supine bridge, low range hip abduction, posterior pelvic tilt.  Progressed to standing weightbearing exercises as tolerated   Lynden Ang, PT 01/16/2023, 11:42 AM

## 2023-01-16 ENCOUNTER — Ambulatory Visit (HOSPITAL_BASED_OUTPATIENT_CLINIC_OR_DEPARTMENT_OTHER): Payer: BC Managed Care – PPO | Attending: Family Medicine | Admitting: Physical Therapy

## 2023-01-16 ENCOUNTER — Encounter (HOSPITAL_BASED_OUTPATIENT_CLINIC_OR_DEPARTMENT_OTHER): Payer: Self-pay | Admitting: Physical Therapy

## 2023-01-16 DIAGNOSIS — R2689 Other abnormalities of gait and mobility: Secondary | ICD-10-CM | POA: Diagnosis present

## 2023-01-16 DIAGNOSIS — M25551 Pain in right hip: Secondary | ICD-10-CM | POA: Diagnosis present

## 2023-01-16 DIAGNOSIS — M25552 Pain in left hip: Secondary | ICD-10-CM | POA: Insufficient documentation

## 2023-01-16 DIAGNOSIS — M25651 Stiffness of right hip, not elsewhere classified: Secondary | ICD-10-CM | POA: Insufficient documentation

## 2023-01-18 NOTE — Progress Notes (Signed)
57 y.o. G54P0000 Married Caucasian female here for annual exam.    Taking ERT.  No hot flashes or night sweats.   Has had fracture of fibula and her pelvis. Using a cane. Taking calcium and vit D.  Had  BMD yesterday.   PCP:   Dr. Paulino Rily  Patient's last menstrual period was 02/10/2014 (exact date).           Sexually active: No.  The current method of family planning is status post hysterectomy.    Exercising: No.   Pt right now Smoker:  no  Health Maintenance: Pap:  06/22/16 neg: HR HPV neg History of abnormal Pap:  no MMG:  09/21/22 Breast Density Cat B, BI-RADS CAT 1 neg Colonoscopy:  09/01/17 BMD:   01/31/23  Result  waiting on result TDaP:  06/22/16 Gardasil:   no HIV: neg per pt Hep C: neg per pt Screening Labs:  PCP   reports that she has never smoked. She has never used smokeless tobacco. She reports current alcohol use. She reports that she does not use drugs.  Past Medical History:  Diagnosis Date   Anxiety 1999   Depression 1999   Fibula fracture    right   Pelvic fracture    right   RA (rheumatoid arthritis) 2013   hands, elbows, knees   Seasonal allergies    Shingles     Past Surgical History:  Procedure Laterality Date   CYSTOSCOPY N/A 02/25/2014   Procedure: CYSTOSCOPY;  Surgeon: Jacqualin Combes de Gwenevere Ghazi, MD;  Location: [facility];  Service: Gynecology;  Laterality: N/A;   ROBOTIC ASSISTED TOTAL HYSTERECTOMY Bilateral 02/25/2014   Procedure: ROBOTIC ASSISTED TOTAL HYSTERECTOMY WITH BILATERAL SALPINGOOPHARECTOMY;  Surgeon: Jacqualin Combes de Gwenevere Ghazi, MD;  Location: [facility];  Service: Gynecology;  Laterality: Bilateral;   WISDOM TOOTH EXTRACTION  2007    Current Outpatient Medications  Medication Sig Dispense Refill   busPIRone (BUSPAR) 15 MG tablet Take 15 mg by mouth.     cholecalciferol (VITAMIN D) 1000 UNITS tablet Take 1,000 Units by mouth daily.     cyclobenzaprine (FLEXERIL) 10 MG tablet Take 10 mg by mouth at bedtime as  needed.     fluticasone (FLONASE) 50 MCG/ACT nasal spray Place 1 spray into both nostrils daily.     folic acid (FOLVITE) 1 MG tablet Take 1 mg by mouth.     HUMIRA PEN 40 MG/0.4ML PNKT      hydroxychloroquine (PLAQUENIL) 200 MG tablet Take 400 mg by mouth daily.      HYRIMOZ 40 MG/0.4ML SOAJ Inject into the skin.     lamoTRIgine (LAMICTAL) 150 MG tablet Take 1 tablet by mouth daily.     LORazepam (ATIVAN) 0.5 MG tablet Take 0.5 mg by mouth as needed.     meloxicam (MOBIC) 15 MG tablet Take 15 mg by mouth daily.     methotrexate (RHEUMATREX) 2.5 MG tablet Take 25 mg by mouth once a week. Every Sunday.  Takes 10 of the 2.5 mg tablets to = 25 mg dose     Methotrexate (XATMEP PO) methotrexate     Multiple Vitamin (MULTIVITAMIN) tablet Take 1 tablet by mouth daily.     Omega-3 Fatty Acids (FISH OIL) 1000 MG CAPS 1 capsule Orally Once a day     predniSONE (STERAPRED UNI-PAK 21 TAB) 10 MG (21) TBPK tablet Take as directed 21 tablet 0   sertraline (ZOLOFT) 50 MG tablet Take 150 mg by mouth daily. 3  tabs daily     traMADol (ULTRAM) 50 MG tablet Take by mouth as needed.     vitamin E 45 MG (100 UNITS) capsule 1 capsule Orally Once a day     estradiol (ESTRACE) 1 MG tablet Take 1 tablet (1 mg total) by mouth daily. 90 tablet 3   No current facility-administered medications for this visit.    Family History  Problem Relation Age of Onset   Thyroid disease Mother        hypothyroid    Breast cancer Mother 49       lumpectomy, no other treatment   Heart failure Father    Dementia Father    Stroke Father    Hypertension Father    Psoriasis Father    Lupus Maternal Grandmother    Breast cancer Maternal Grandmother 1    Review of Systems  All other systems reviewed and are negative.   Exam:   BP 112/78 (BP Location: Left Arm, Patient Position: Sitting, Cuff Size: Normal)   Pulse 94   Ht  (1.803 m)   Wt 232 lb (105.2 kg)   LMP 02/10/2014 (Exact Date)   SpO2 97%   BMI 32.36 kg/m      General appearance: alert, cooperative and appears stated age Head: normocephalic, without obvious abnormality, atraumatic Neck: no adenopathy, supple, symmetrical, trachea midline and thyroid normal to inspection and palpation Lungs: clear to auscultation bilaterally Breasts: normal appearance, no masses or tenderness, No nipple retraction or dimpling, No nipple discharge or bleeding, No axillary adenopathy Heart: regular rate and rhythm Abdomen: soft, non-tender; no masses, no organomegaly Extremities: extremities normal, atraumatic, no cyanosis or edema Skin: skin color, texture, turgor normal. No rashes or lesions Lymph nodes: cervical, supraclavicular, and axillary nodes normal. Neurologic: grossly normal  Pelvic: External genitalia:  no lesions              No abnormal inguinal nodes palpated.              Urethra:  normal appearing urethra with no masses, tenderness or lesions              Bartholins and Skenes: normal                 Vagina: normal appearing vagina with normal color and discharge, no lesions.  Vaginal atrophy noted.              Cervix: absent              Pap taken: no Bimanual Exam:  Uterus:  absent              Adnexa: no mass, fullness, tenderness              Rectal exam: yes.  Confirms.              Anus:  normal sphincter tone, no lesions  Chaperone was present for exam:  Warren Lacy, CMA  Assessment:   Well woman visit with gynecologic exam. Status post robotic total laparoscopic hysterectomy with bilateral salpingo-oophorectomy, cystoscopy 2015.  On ERT.  Vaginal atrophy.  FH breast cancer in mother and maternal grandmother. RA. Fractures.  Plan: Mammogram screening discussed. Self breast awareness reviewed. Pap and HR HPV as above. Guidelines for Calcium, Vitamin D, regular exercise program including cardiovascular and weight bearing exercise. Refill of ERT for one year.  I discussed potential increased risk of stroke, DVT, and PE.    Patient declines vaginal estrogen. Referral for genetic  counseling and testing.  Follow up annually and prn.   After visit summary provided.

## 2023-01-22 NOTE — Therapy (Signed)
OUTPATIENT PHYSICAL THERAPY THORACOLUMBAR Treatment    Patient Name: Jenny Bailey MRN: [id number] DOB:23-Jul-1966, 57 y.o., female Today's Date: 01/23/2023  END OF SESSION:  PT End of Session - 01/23/23 1119     Visit Number 3    Number of Visits 12    Date for PT Re-Evaluation 02/17/23    PT Start Time 1116    PT Stop Time 1200    PT Time Calculation (min) 44 min    Activity Tolerance No increased pain;Patient tolerated treatment well    Behavior During Therapy [facility] for tasks assessed/performed              Past Medical History:  Diagnosis Date   Anxiety 1999   Depression 1999   RA (rheumatoid arthritis) 2013   hands, elbows, knees   Seasonal allergies    Shingles    Past Surgical History:  Procedure Laterality Date   CYSTOSCOPY N/A 02/25/2014   Procedure: CYSTOSCOPY;  Surgeon: Jacqualin Combes de Gwenevere Ghazi, MD;  Location: [facility];  Service: Gynecology;  Laterality: N/A;   ROBOTIC ASSISTED TOTAL HYSTERECTOMY Bilateral 02/25/2014   Procedure: ROBOTIC ASSISTED TOTAL HYSTERECTOMY WITH BILATERAL SALPINGOOPHARECTOMY;  Surgeon: Jacqualin Combes de Gwenevere Ghazi, MD;  Location: [facility];  Service: Gynecology;  Laterality: Bilateral;   WISDOM TOOTH EXTRACTION  2007   Patient Active Problem List   Diagnosis Date Noted   Arthritis, rheumatoid 06/16/2015   Post-menopause on HRT (hormone replacement therapy) 06/16/2015   Anxiety and depression 06/16/2015    PCP: Mila Palmer MD  REFERRING PROVIDER: Mila Palmer MD  REFERRING DIAG: Pelvic fx, labrum tear,   Rationale for Evaluation and Treatment: Rehabilitation  THERAPY DIAG:  Stiffness of right hip, not elsewhere classified  Pain in right hip  Other abnormalities of gait and mobility  ONSET DATE: December 2023  SUBJECTIVE:                                                                                                                                                                                            SUBJECTIVE STATEMENT: Pt states not afraid of water   PERTINENT HISTORY:  RA, depression, anxiety, shingles,   PAIN:  Are you having pain? Yes: NPRS scale: 2/10 Pain location: left side of the hip Pain description: aching  Aggravating factors: lifting  Relieving factors: medication, flexoril, but has also used a heating pad   PRECAUTIONS: None  WEIGHT BEARING RESTRICTIONS: No  FALLS:  Has patient fallen in last 6 months? No  LIVING ENVIRONMENT: Has ramp in the back of her house.   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 Indeterminate fracture and erosion at the junction of the right pubic rami lateral to the symphysis. 2. But the left hemipelvis and proximal left femur appear intact. No other osseous abnormality identified.    PATIENT SURVEYS:  FOTO    SCREENING FOR RED FLAGS: Bowel or bladder incontinence: No Spinal tumors: No Cauda equina syndrome: No Compression fracture: No Abdominal aneurysm: No  COGNITION: Overall cognitive status: Within functional limits for tasks assessed     SENSATION: WFL  MUSCLE LENGTH:   POSTURE: rounded shoulders and forward head  PALPATION: Upper gluteal region on right side  Worse on the left upper gluteal region Pain bilateral IT band   LUMBAR ROM:   AROM eval  Flexion 90 without pain   Extension No pain   Right lateral flexion   Left lateral flexion   Right rotation Feels a little pull on lateral hip  Left rotation No pain    (Blank rows = not tested)  LOWER EXTREMITY ROM:     Passive  Right eval Left eval  Hip flexion 90 degrees mild pain    Hip extension    Hip abduction    Hip adduction    Hip internal rotation Mild pain    Hip external rotation Full pain free    Knee  flexion    Knee extension    Ankle dorsiflexion    Ankle plantarflexion    Ankle inversion    Ankle eversion     (Blank rows = not tested)  LOWER EXTREMITY MMT:    MMT Right eval Left eval  Hip flexion 7 11.4  Hip extension    Hip abduction 13.2 17.4  Hip adduction    Hip internal rotation    Hip external rotation    Knee flexion    Knee extension 21 17.8  Ankle dorsiflexion    Ankle plantarflexion    Ankle inversion    Ankle eversion     (Blank rows = not tested)    GAIT: Without walker: decreased weight bearing and lateral shift away from right side then back away from left.   TODAY'S TREATMENT:     01/23/23 Pt seen for aquatic therapy today.  Treatment took place in water 3.5-4.75 ft in depth at the [facility] pool. Temp of water was 91.  Pt entered/exited the pool via stairs using step to pattern with hand rail.  *walking ue supported on barbell 3.6 ft then into 4.3 ft forward and backward. *Ue support on barbell: add/abd x 5.  Moved to wall for increased ue support: add/abd cues and demonstration for proper execution) 2 x 10; mini squats weight through heels x12 *side stepping R and L x 4 widths 3.8 ft.  Added rainbow HB submerged at sides for added resistance and core engagement and cues for abdominal bracing x 6 widths *Seated on 3rd step: cycling; add/abd; flutter kicking 2 sets ~2 mins each *supported by yellow noodle wrapped across chest anteriorly then posteriorly then straddled. Pt does not tolerated straddled position. Focus on balance to complete cycling and add/abd *standing ue supported on wall: high knee marching and hip circles cw&ccw x10-12  Pt requires the buoyancy and hydrostatic pressure of water for support, and to offload joints by unweighting joint load  by at least 50 % in navel deep water and by at least 75-80% in chest to neck deep water.  Viscosity of the water is needed for resistance of strengthening. Water current perturbations  provides challenge to standing balance requiring increased core activation.     Date 01/16/2023:  PPT  Figure 4 stretch 2x30, bilat  Bent knee fall outs, bilat  Adductor ball squeeze 5 sec hold Supine clams with RTB  LTR  Heel slides, bilat  Bridges as tolerated  Sit to stand from high surface with UE use as needed.  Seated heel raises  Seated LAQ, bilat                                                                                                                         DATE: 01/05/2023   PATIENT EDUCATION:  Education details: HEP, symptom management; POC  Person educated: Patient Education method: Explanation, Demonstration, Tactile cues, Verbal cues, and Handouts Education comprehension: verbalized understanding, returned demonstration, verbal cues required, tactile cues required, and needs further education  HOME EXERCISE PROGRAM: Access Code: [email or web address] URL: [email or web address] Date: 01/16/2023 Prepared by: Royal Hawthorn  Exercises - Supine Bridge  - 2 x daily - 7 x weekly - 2 sets - 10 reps - Supine Posterior Pelvic Tilt  - 2 x daily - 7 x weekly - 2 sets - 10 reps - Supine Lower Trunk Rotation  - 2 x daily - 7 x weekly - 2 sets - 10 reps - Supine Hip Adduction Isometric with Ball  - 2 x daily - 7 x weekly - 2 sets - 10 reps - Hooklying Clamshell with Resistance  - 2 x daily - 7 x weekly - 2 sets - 10 reps - Seated Long Arc Quad  - 2 x daily - 7 x weekly - 2 sets - 10 reps - Supine Heel Slide with Strap  - 2 x daily - 7 x weekly - 2 sets - 10 reps - Supine Figure 4 Piriformis Stretch  - 2 x daily - 7 x weekly - 2 sets - 10 reps - Sit to Stand  - 2 x daily - 7 x weekly - 2 sets - 10 reps  Aquatic: walking all directions; seated cycling; add/abd and flutter kicking  ASSESSMENT:  CLINICAL IMPRESSION: Pt demonstrates indep and safety in setting with therapist instructing on deck. Focused on gentle mobility of hips all planes. Varied speed as tolerated  with exercise to increase/decrease resistance.  She tolerates well although without change in pain level.  She has membership here at [facility] [facility] and is instructed on exercises to complete in pool. Will create organized aquatic HEP going forward. She is a good candidate for aquatic therapy intervention and will benefit from the properties of water to progress towards meeting set goals.    OBJECTIVE IMPAIRMENTS: Abnormal gait, decreased activity tolerance, decreased endurance, difficulty walking, decreased ROM, decreased strength, increased muscle spasms, and pain.   ACTIVITY LIMITATIONS: carrying, lifting, bending, standing, squatting,  sleeping, stairs, and locomotion level  PARTICIPATION LIMITATIONS: meal prep, cleaning, laundry, driving, shopping, community activity, and occupation  PERSONAL FACTORS: 1-2 comorbidities: RA   are also affecting patient's functional outcome.   REHAB POTENTIAL: Good  CLINICAL DECISION MAKING: Evolving/moderate complexity progressive loss of mobility   EVALUATION COMPLEXITY: Moderate   GOALS: Goals reviewed with patient? Yes  SHORT TERM GOALS: Target date: 01/27/2023     Patient will ambulate 300 feet with single-point cane without increased pain in bilateral hip Baseline: Goal status: INITIAL  2.  Patient will increase gross bilateral lower extremity strength by 5 pounds Baseline:  Goal status: INITIAL  3.  Patient will report a 50% reduction in pain in left hip while standing and walking Baseline:  Goal status: INITIAL  4.  Patient will be independent with basic HEP Baseline:  Goal status: INITIAL    LONG TERM GOALS: Target date: 02/17/2023    Patient will ambulate 2000 feet without self-report of increased bilateral hip pain without an assistive device Baseline:  Goal status: INITIAL  2.  Patient will stand for 30 minutes without report of increased bilateral hip pain in order to perform ADLs Baseline:  Goal status: INITIAL  3.   Patient will have a complete land and aquatic exercise program to promote increased strengthening and increased functional mobility Baseline:  Goal status: INITIAL   PLAN:  PT FREQUENCY: 1-2x/week  PT DURATION: 6 weeks  PLANNED INTERVENTIONS: Therapeutic exercises, Therapeutic activity, Neuromuscular re-education, Balance training, Gait training, Patient/Family education, Self Care, Joint mobilization, DME instructions, Cryotherapy, Moist heat, Ultrasound, and Manual therapy.  PLAN FOR NEXT SESSION: And water: Work on normalizing gait.  Patient has decreased weightbearing on right lower extremity and significant lateral shifting when not using a device.  Work on bilateral hip strengthening work on core stability and strengthening.  Land: Soft tissue mobilization to left and right hip.  Be aware of right hip labral tear and right pubic rami fracture.  Head and base supine core stability exercises including supine march, supine bridge, low range hip abduction, posterior pelvic tilt.  Progressed to standing weightbearing exercises as tolerated   Corrie Dandy (Frankie) Kaelen Brennan MPT 01/23/2023, 11:26 AM

## 2023-01-23 ENCOUNTER — Ambulatory Visit (HOSPITAL_BASED_OUTPATIENT_CLINIC_OR_DEPARTMENT_OTHER): Payer: BC Managed Care – PPO | Admitting: Physical Therapy

## 2023-01-23 ENCOUNTER — Encounter (HOSPITAL_BASED_OUTPATIENT_CLINIC_OR_DEPARTMENT_OTHER): Payer: Self-pay | Admitting: Physical Therapy

## 2023-01-23 DIAGNOSIS — M25651 Stiffness of right hip, not elsewhere classified: Secondary | ICD-10-CM | POA: Diagnosis not present

## 2023-01-23 DIAGNOSIS — M25551 Pain in right hip: Secondary | ICD-10-CM

## 2023-01-23 DIAGNOSIS — R2689 Other abnormalities of gait and mobility: Secondary | ICD-10-CM

## 2023-01-25 ENCOUNTER — Other Ambulatory Visit: Payer: Self-pay | Admitting: Rheumatology

## 2023-01-25 DIAGNOSIS — E2839 Other primary ovarian failure: Secondary | ICD-10-CM

## 2023-01-31 LAB — HM DEXA SCAN: HM Dexa Scan: NORMAL

## 2023-02-01 ENCOUNTER — Ambulatory Visit (INDEPENDENT_AMBULATORY_CARE_PROVIDER_SITE_OTHER): Payer: BC Managed Care – PPO | Admitting: Obstetrics and Gynecology

## 2023-02-01 ENCOUNTER — Encounter: Payer: Self-pay | Admitting: Obstetrics and Gynecology

## 2023-02-01 ENCOUNTER — Telehealth: Payer: Self-pay | Admitting: Genetic Counselor

## 2023-02-01 VITALS — BP 112/78 | HR 94 | Ht 71.0 in | Wt 232.0 lb

## 2023-02-01 DIAGNOSIS — Z803 Family history of malignant neoplasm of breast: Secondary | ICD-10-CM

## 2023-02-01 DIAGNOSIS — Z01419 Encounter for gynecological examination (general) (routine) without abnormal findings: Secondary | ICD-10-CM

## 2023-02-01 DIAGNOSIS — Z79899 Other long term (current) drug therapy: Secondary | ICD-10-CM | POA: Diagnosis not present

## 2023-02-01 DIAGNOSIS — N951 Menopausal and female climacteric states: Secondary | ICD-10-CM | POA: Diagnosis not present

## 2023-02-01 MED ORDER — ESTRADIOL 1 MG PO TABS: 1.0000 mg | ORAL_TABLET | Freq: Every day | ORAL | 3 refills | Status: DC

## 2023-02-01 NOTE — Patient Instructions (Signed)

## 2023-02-01 NOTE — Telephone Encounter (Signed)
Reached out to patient to schedule per IB message left voicemail.

## 2023-02-02 ENCOUNTER — Encounter (HOSPITAL_BASED_OUTPATIENT_CLINIC_OR_DEPARTMENT_OTHER): Payer: BC Managed Care – PPO | Admitting: Physical Therapy

## 2023-02-05 NOTE — Therapy (Signed)
OUTPATIENT PHYSICAL THERAPY THORACOLUMBAR Treatment    Patient Name: Traci Peers MRN: [id number] DOB:23-Jul-1966, 57 y.o., female Today's Date: 01/23/2023  END OF SESSION:  PT End of Session - 01/23/23 1119     Visit Number 3    Number of Visits 12    Date for PT Re-Evaluation 02/17/23    PT Start Time 1116    PT Stop Time 1200    PT Time Calculation (min) 44 min    Activity Tolerance No increased pain;Patient tolerated treatment well    Behavior During Therapy [facility] for tasks assessed/performed              Past Medical History:  Diagnosis Date   Anxiety 1999   Depression 1999   RA (rheumatoid arthritis) 2013   hands, elbows, knees   Seasonal allergies    Shingles    Past Surgical History:  Procedure Laterality Date   CYSTOSCOPY N/A 02/25/2014   Procedure: CYSTOSCOPY;  Surgeon: Jacqualin Combes de Gwenevere Ghazi, MD;  Location: [facility];  Service: Gynecology;  Laterality: N/A;   ROBOTIC ASSISTED TOTAL HYSTERECTOMY Bilateral 02/25/2014   Procedure: ROBOTIC ASSISTED TOTAL HYSTERECTOMY WITH BILATERAL SALPINGOOPHARECTOMY;  Surgeon: Jacqualin Combes de Gwenevere Ghazi, MD;  Location: [facility];  Service: Gynecology;  Laterality: Bilateral;   WISDOM TOOTH EXTRACTION  2007   Patient Active Problem List   Diagnosis Date Noted   Arthritis, rheumatoid 06/16/2015   Post-menopause on HRT (hormone replacement therapy) 06/16/2015   Anxiety and depression 06/16/2015    PCP: Mila Palmer MD  REFERRING PROVIDER: Mila Palmer MD  REFERRING DIAG: Pelvic fx, labrum tear,   Rationale for Evaluation and Treatment: Rehabilitation  THERAPY DIAG:  Stiffness of right hip, not elsewhere classified  Pain in right hip  Other abnormalities of gait and mobility  ONSET DATE: December 2023  SUBJECTIVE:                                                                                                                                                                                            SUBJECTIVE STATEMENT: Pt states not afraid of water   PERTINENT HISTORY:  RA, depression, anxiety, shingles,   PAIN:  Are you having pain? Yes: NPRS scale: 2/10 Pain location: left side of the hip Pain description: aching  Aggravating factors: lifting  Relieving factors: medication, flexoril, but has also used a heating pad   PRECAUTIONS: None  WEIGHT BEARING RESTRICTIONS: No  FALLS:  Has patient fallen in last 6 months? No  LIVING ENVIRONMENT: Has ramp in the back of her house.   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 Indeterminate fracture and erosion at the junction of the right pubic rami lateral to the symphysis. 2. But the left hemipelvis and proximal left femur appear intact. No other osseous abnormality identified.    PATIENT SURVEYS:  FOTO    SCREENING FOR RED FLAGS: Bowel or bladder incontinence: No Spinal tumors: No Cauda equina syndrome: No Compression fracture: No Abdominal aneurysm: No  COGNITION: Overall cognitive status: Within functional limits for tasks assessed     SENSATION: WFL  MUSCLE LENGTH:   POSTURE: rounded shoulders and forward head  PALPATION: Upper gluteal region on right side  Worse on the left upper gluteal region Pain bilateral IT band   LUMBAR ROM:   AROM eval  Flexion 90 without pain   Extension No pain   Right lateral flexion   Left lateral flexion   Right rotation Feels a little pull on lateral hip  Left rotation No pain    (Blank rows = not tested)  LOWER EXTREMITY ROM:     Passive  Right eval Left eval  Hip flexion 90 degrees mild pain    Hip extension    Hip abduction    Hip adduction    Hip internal rotation Mild pain    Hip external rotation Full pain free    Knee  flexion    Knee extension    Ankle dorsiflexion    Ankle plantarflexion    Ankle inversion    Ankle eversion     (Blank rows = not tested)  LOWER EXTREMITY MMT:    MMT Right eval Left eval  Hip flexion 7 11.4  Hip extension    Hip abduction 13.2 17.4  Hip adduction    Hip internal rotation    Hip external rotation    Knee flexion    Knee extension 21 17.8  Ankle dorsiflexion    Ankle plantarflexion    Ankle inversion    Ankle eversion     (Blank rows = not tested)    GAIT: Without walker: decreased weight bearing and lateral shift away from right side then back away from left.   TODAY'S TREATMENT:     01/23/23 Pt seen for aquatic therapy today.  Treatment took place in water 3.5-4.75 ft in depth at the [facility] pool. Temp of water was 91.  Pt entered/exited the pool via stairs using step to pattern with hand rail.  *walking ue supported on barbell 3.6 ft then into 4.3 ft forward and backward. *Ue support on barbell: add/abd x 5.  Moved to wall for increased ue support: add/abd cues and demonstration for proper execution) 2 x 10; mini squats weight through heels x12 *side stepping R and L x 4 widths 3.8 ft.  Added rainbow HB submerged at sides for added resistance and core engagement and cues for abdominal bracing x 6 widths *Seated on 3rd step: cycling; add/abd; flutter kicking 2 sets ~2 mins each *supported by yellow noodle wrapped across chest anteriorly then posteriorly then straddled. Pt does not tolerated straddled position. Focus on balance to complete cycling and add/abd *standing ue supported on wall: high knee marching and hip circles cw&ccw x10-12  Pt requires the buoyancy and hydrostatic pressure of water for support, and to offload joints by unweighting joint load  by at least 50 % in navel deep water and by at least 75-80% in chest to neck deep water.  Viscosity of the water is needed for resistance of strengthening. Water current perturbations  provides challenge to standing balance requiring increased core activation.     Date 01/16/2023:  PPT  Figure 4 stretch 2x30, bilat  Bent knee fall outs, bilat  Adductor ball squeeze 5 sec hold Supine clams with RTB  LTR  Heel slides, bilat  Bridges as tolerated  Sit to stand from high surface with UE use as needed.  Seated heel raises  Seated LAQ, bilat                                                                                                                         DATE: 01/05/2023   PATIENT EDUCATION:  Education details: HEP, symptom management; POC  Person educated: Patient Education method: Explanation, Demonstration, Tactile cues, Verbal cues, and Handouts Education comprehension: verbalized understanding, returned demonstration, verbal cues required, tactile cues required, and needs further education  HOME EXERCISE PROGRAM: Access Code: [email or web address] URL: [email or web address] Date: 01/16/2023 Prepared by: Royal Hawthorn  Exercises - Supine Bridge  - 2 x daily - 7 x weekly - 2 sets - 10 reps - Supine Posterior Pelvic Tilt  - 2 x daily - 7 x weekly - 2 sets - 10 reps - Supine Lower Trunk Rotation  - 2 x daily - 7 x weekly - 2 sets - 10 reps - Supine Hip Adduction Isometric with Ball  - 2 x daily - 7 x weekly - 2 sets - 10 reps - Hooklying Clamshell with Resistance  - 2 x daily - 7 x weekly - 2 sets - 10 reps - Seated Long Arc Quad  - 2 x daily - 7 x weekly - 2 sets - 10 reps - Supine Heel Slide with Strap  - 2 x daily - 7 x weekly - 2 sets - 10 reps - Supine Figure 4 Piriformis Stretch  - 2 x daily - 7 x weekly - 2 sets - 10 reps - Sit to Stand  - 2 x daily - 7 x weekly - 2 sets - 10 reps  Aquatic: walking all directions; seated cycling; add/abd and flutter kicking  ASSESSMENT:  CLINICAL IMPRESSION: Pt demonstrates indep and safety in setting with therapist instructing on deck. Focused on gentle mobility of hips all planes. Varied speed as tolerated  with exercise to increase/decrease resistance.  She tolerates well although without change in pain level.  She has membership here at [facility] [facility] and is instructed on exercises to complete in pool. Will create organized aquatic HEP going forward. She is a good candidate for aquatic therapy intervention and will benefit from the properties of water to progress towards meeting set goals.    OBJECTIVE IMPAIRMENTS: Abnormal gait, decreased activity tolerance, decreased endurance, difficulty walking, decreased ROM, decreased strength, increased muscle spasms, and pain.   ACTIVITY LIMITATIONS: carrying, lifting, bending, standing, squatting,  sleeping, stairs, and locomotion level  PARTICIPATION LIMITATIONS: meal prep, cleaning, laundry, driving, shopping, community activity, and occupation  PERSONAL FACTORS: 1-2 comorbidities: RA   are also affecting patient's functional outcome.   REHAB POTENTIAL: Good  CLINICAL DECISION MAKING: Evolving/moderate complexity progressive loss of mobility   EVALUATION COMPLEXITY: Moderate   GOALS: Goals reviewed with patient? Yes  SHORT TERM GOALS: Target date: 01/27/2023     Patient will ambulate 300 feet with single-point cane without increased pain in bilateral hip Baseline: Goal status: INITIAL  2.  Patient will increase gross bilateral lower extremity strength by 5 pounds Baseline:  Goal status: INITIAL  3.  Patient will report a 50% reduction in pain in left hip while standing and walking Baseline:  Goal status: INITIAL  4.  Patient will be independent with basic HEP Baseline:  Goal status: INITIAL    LONG TERM GOALS: Target date: 02/17/2023    Patient will ambulate 2000 feet without self-report of increased bilateral hip pain without an assistive device Baseline:  Goal status: INITIAL  2.  Patient will stand for 30 minutes without report of increased bilateral hip pain in order to perform ADLs Baseline:  Goal status: INITIAL  3.   Patient will have a complete land and aquatic exercise program to promote increased strengthening and increased functional mobility Baseline:  Goal status: INITIAL   PLAN:  PT FREQUENCY: 1-2x/week  PT DURATION: 6 weeks  PLANNED INTERVENTIONS: Therapeutic exercises, Therapeutic activity, Neuromuscular re-education, Balance training, Gait training, Patient/Family education, Self Care, Joint mobilization, DME instructions, Cryotherapy, Moist heat, Ultrasound, and Manual therapy.  PLAN FOR NEXT SESSION: And water: Work on normalizing gait.  Patient has decreased weightbearing on right lower extremity and significant lateral shifting when not using a device.  Work on bilateral hip strengthening work on core stability and strengthening.  Land: Soft tissue mobilization to left and right hip.  Be aware of right hip labral tear and right pubic rami fracture.  Head and base supine core stability exercises including supine march, supine bridge, low range hip abduction, posterior pelvic tilt.  Progressed to standing weightbearing exercises as tolerated   Corrie Dandy (Frankie) Deloria Brassfield MPT 01/23/2023, 11:26 AM

## 2023-02-06 ENCOUNTER — Ambulatory Visit (HOSPITAL_BASED_OUTPATIENT_CLINIC_OR_DEPARTMENT_OTHER): Payer: BC Managed Care – PPO | Admitting: Physical Therapy

## 2023-02-06 ENCOUNTER — Encounter (HOSPITAL_BASED_OUTPATIENT_CLINIC_OR_DEPARTMENT_OTHER): Payer: Self-pay | Admitting: Physical Therapy

## 2023-02-06 DIAGNOSIS — M25651 Stiffness of right hip, not elsewhere classified: Secondary | ICD-10-CM | POA: Diagnosis not present

## 2023-02-06 DIAGNOSIS — M25551 Pain in right hip: Secondary | ICD-10-CM

## 2023-02-06 DIAGNOSIS — R2689 Other abnormalities of gait and mobility: Secondary | ICD-10-CM

## 2023-02-06 DIAGNOSIS — M25552 Pain in left hip: Secondary | ICD-10-CM

## 2023-02-13 ENCOUNTER — Encounter (HOSPITAL_BASED_OUTPATIENT_CLINIC_OR_DEPARTMENT_OTHER): Payer: Self-pay

## 2023-02-13 ENCOUNTER — Ambulatory Visit (HOSPITAL_BASED_OUTPATIENT_CLINIC_OR_DEPARTMENT_OTHER): Payer: BC Managed Care – PPO

## 2023-02-13 DIAGNOSIS — M25651 Stiffness of right hip, not elsewhere classified: Secondary | ICD-10-CM | POA: Diagnosis not present

## 2023-02-13 DIAGNOSIS — M25551 Pain in right hip: Secondary | ICD-10-CM

## 2023-02-13 DIAGNOSIS — R2689 Other abnormalities of gait and mobility: Secondary | ICD-10-CM

## 2023-02-13 NOTE — Therapy (Addendum)
OUTPATIENT PHYSICAL THERAPY THORACOLUMBAR Treatment/discharge    Patient Name: Jenny Bailey MRN: [id number] DOB:1966/07/21, 57 y.o., female Today's Date: 02/13/2023  END OF SESSION:  PT End of Session - 02/13/23 1156     Visit Number 5    Number of Visits 12    Date for PT Re-Evaluation 02/17/23    PT Start Time 1108    PT Stop Time 1147    PT Time Calculation (min) 39 min    Activity Tolerance Patient tolerated treatment well    Behavior During Therapy [facility] for tasks assessed/performed               Past Medical History:  Diagnosis Date   Anxiety 1999   Depression 1999   Fibula fracture    right   Pelvic fracture (HCC)    right   RA (rheumatoid arthritis) (HCC) 2013   hands, elbows, knees   Seasonal allergies    Shingles    Past Surgical History:  Procedure Laterality Date   CYSTOSCOPY N/A 02/25/2014   Procedure: CYSTOSCOPY;  Surgeon: Jacqualin Combes de Gwenevere Ghazi, MD;  Location: [facility];  Service: Gynecology;  Laterality: N/A;   ROBOTIC ASSISTED TOTAL HYSTERECTOMY Bilateral 02/25/2014   Procedure: ROBOTIC ASSISTED TOTAL HYSTERECTOMY WITH BILATERAL SALPINGOOPHARECTOMY;  Surgeon: Jacqualin Combes de Gwenevere Ghazi, MD;  Location: [facility];  Service: Gynecology;  Laterality: Bilateral;   WISDOM TOOTH EXTRACTION  2007   Patient Active Problem List   Diagnosis Date Noted   Pain in joint of right hip 11/28/2022   Closed fracture of lateral malleolus of right fibula 02/08/2022   Arthritis, rheumatoid (HCC) 06/16/2015   Post-menopause on HRT (hormone replacement therapy) 06/16/2015   Anxiety and depression 06/16/2015    PCP: Mila Palmer MD  REFERRING PROVIDER: Mila Palmer MD  REFERRING DIAG: Pelvic fx, labrum tear,   Rationale for Evaluation and Treatment: Rehabilitation  THERAPY DIAG:  Stiffness of right hip, not elsewhere classified  Pain in right hip  Other abnormalities of gait and mobility  ONSET DATE: December 2023  SUBJECTIVE:                                                                                                                                                                                            SUBJECTIVE STATEMENT:  Pt reports benefit from aquatic therapy. Does have 1-2/10 pain level in anterior hip that remains pretty constant.  PERTINENT HISTORY:  RA, depression, anxiety, shingles,   PAIN:  Are you having pain? Yes: NPRS scale: 1-2/10 Pain location: medial R hip Pain description: aching  Aggravating factors: lifting  Relieving factors: medication,  flexoril, but has also used a heating pad   PRECAUTIONS: None  WEIGHT BEARING RESTRICTIONS: No  FALLS:  Has patient fallen in last 6 months? No  LIVING ENVIRONMENT: Has ramp in the back of her house.   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 Indeterminate fracture and erosion at the junction of the right pubic rami lateral to the symphysis. 2. But the left hemipelvis and proximal left femur appear intact. No other osseous abnormality identified.    PATIENT SURVEYS:  FOTO    SCREENING FOR RED FLAGS: Bowel or bladder incontinence: No Spinal tumors: No Cauda equina syndrome: No Compression fracture: No Abdominal aneurysm: No  COGNITION: Overall cognitive status: Within functional limits for tasks assessed     SENSATION: WFL  MUSCLE LENGTH:   POSTURE: rounded shoulders and forward head  PALPATION: Upper gluteal region on right side  Worse on the left upper gluteal region Pain bilateral IT band   LUMBAR ROM:   AROM eval  Flexion 90 without pain   Extension No pain   Right lateral flexion   Left lateral flexion   Right rotation Feels a little pull on lateral hip  Left rotation No pain    (Blank  rows = not tested)  LOWER EXTREMITY ROM:     Passive  Right eval Left eval  Hip flexion 90 degrees mild pain    Hip extension    Hip abduction    Hip adduction    Hip internal rotation Mild pain    Hip external rotation Full pain free    Knee flexion    Knee extension    Ankle dorsiflexion    Ankle plantarflexion    Ankle inversion    Ankle eversion     (Blank rows = not tested)  LOWER EXTREMITY MMT:    MMT Right eval Left eval  Hip flexion 7 11.4  Hip extension    Hip abduction 13.2 17.4  Hip adduction    Hip internal rotation    Hip external rotation    Knee flexion    Knee extension 21 17.8  Ankle dorsiflexion    Ankle plantarflexion    Ankle inversion    Ankle eversion     (Blank rows = not tested)    GAIT: Without walker: decreased weight bearing and lateral shift away from right side then back away from left.   TODAY'S TREATMENT:      Date 02/13/2023:  R hip  PROM STM to R hip adductors (mid/proximal) Sidelying clam x10R Prone ER with R LE (ER was painful) Prone hip extension (partial) 2x10 bil with knee flexed  Figure 4 stretch 2x30, bilat  HSS R LE manual Isometric hip flexion in hooklying (pink ball press) 5 sec hold x10 LTRx20                                                                02/06/23 Pt seen for aquatic therapy today.  Treatment took place in water 3.5-4.75 ft in depth at the [facility]. Temp of water was 91.  Pt entered/exited the pool via stairs using step to pattern with hand rail.  *walking ue supported on barbell 3.6 ft then into 4.3 ft forward and backward. *Ue support on  wall then barbell then without UE support: add/abd; mini squats weight through heels; hip add/bad; 3 way tap  x15; hip flex/ext x 10 *Side lunging using rainbow hand buoys with difficulty coordinating. Changed to side lunging R/L x 5 *supported by yellow noodle wrapped across chest posteriorly cycling; add/abd then straddled *Straddles  noodle: LE jumping jacks and hurdle jumps 4.7 ft *standing ue supported on wall: high knee marching and hip circles cw&ccw x10-12  Pt requires the buoyancy and hydrostatic pressure of water for support, and to offload joints by unweighting joint load by at least 50 % in navel deep water and by at least 75-80% in chest to neck deep water.  Viscosity of the water is needed for resistance of strengthening. Water current perturbations provides challenge to standing balance requiring increased core activation.     Date 01/16/2023:  PPT  Figure 4 stretch 2x30, bilat  Bent knee fall outs, bilat  Adductor ball squeeze 5 sec hold Supine clams with RTB  LTR  Heel slides, bilat  Bridges as tolerated  Sit to stand from high surface with UE use as needed.  Seated heel raises  Seated LAQ, bilat                                                                                                                           PATIENT EDUCATION:  Education details: HEP, symptom management; POC  Person educated: Patient Education method: Explanation, Demonstration, Tactile cues, Verbal cues, and Handouts Education comprehension: verbalized understanding, returned demonstration, verbal cues required, tactile cues required, and needs further education  HOME EXERCISE PROGRAM: Access Code: [email or web address] URL: [email or web address] Date: 01/16/2023 Prepared by: Royal Hawthorn  Exercises - Supine Bridge  - 2 x daily - 7 x weekly - 2 sets - 10 reps - Supine Posterior Pelvic Tilt  - 2 x daily - 7 x weekly - 2 sets - 10 reps - Supine Lower Trunk Rotation  - 2 x daily - 7 x weekly - 2 sets - 10 reps - Supine Hip Adduction Isometric with Ball  - 2 x daily - 7 x weekly - 2 sets - 10 reps - Hooklying Clamshell with Resistance  - 2 x daily - 7 x weekly - 2 sets - 10 reps - Seated Long Arc Quad  - 2 x daily - 7 x weekly - 2 sets - 10 reps - Supine Heel Slide with Strap  - 2 x daily - 7 x weekly - 2 sets - 10  reps - Supine Figure 4 Piriformis Stretch  - 2 x daily - 7 x weekly - 2 sets - 10 reps - Sit to Stand  - 2 x daily - 7 x weekly - 2 sets - 10 reps  Aquatic: walking all directions; seated cycling; add/abd and flutter kicking  ASSESSMENT:  CLINICAL IMPRESSION: Pt was tight into hip abduction and reported tenderness in proximal/mid adductors. Worked on [employer] to address this. Pt educated about DOMS And management at home. Difficulty and pain with hooklying hip flexion so switched to hip flexor isometric instead. Pt has joined [employer] and will now have access to pool. She asks whether the Right Start program is appropriate for her, to which I recommended she wait until we progress her land therapy to imrpove her exercise tolerance. Will update HEP to include aquatic exercises and glute max/ hip flexor strengthening. Pt reports pain with bridges at home, so instructed her to stop this one for now.     OBJECTIVE IMPAIRMENTS: Abnormal gait, decreased activity tolerance, decreased endurance, difficulty walking, decreased ROM, decreased strength, increased muscle spasms, and pain.   ACTIVITY LIMITATIONS: carrying, lifting, bending, standing, squatting, sleeping, stairs, and locomotion level  PARTICIPATION LIMITATIONS: meal prep, cleaning, laundry, driving, shopping, community activity, and occupation  PERSONAL FACTORS: 1-2 comorbidities: RA   are also affecting patient's functional outcome.   REHAB POTENTIAL: Good  CLINICAL DECISION MAKING: Evolving/moderate complexity progressive loss of mobility   EVALUATION COMPLEXITY: Moderate   GOALS: Goals reviewed with patient? Yes  SHORT TERM GOALS: Target date: 01/27/2023     Patient will ambulate 300 feet with single-point cane without increased pain in bilateral hip Baseline: Goal status:met 02/06/23  2.  Patient will increase gross bilateral lower extremity strength by 5 pounds Baseline:  Goal status: INITIAL  3.  Patient will report a 50%  reduction in pain in left hip while standing and walking Baseline:  Goal status: Met 02/06/23  4.  Patient will be independent with basic HEP Baseline:  Goal status: INITIAL    LONG TERM GOALS: Target date: 02/17/2023    Patient will ambulate 2000 feet without self-report of increased bilateral hip pain without an assistive device Baseline:  Goal status: INITIAL  2.  Patient will stand for 30 minutes without report of increased bilateral hip pain in order to perform ADLs Baseline:  Goal status: INITIAL  3.  Patient will have a complete land and aquatic exercise program to promote increased strengthening and increased functional mobility Baseline:  Goal status: INITIAL   PLAN:  PT FREQUENCY: 1-2x/week  PT DURATION: 6 weeks  PLANNED INTERVENTIONS: Therapeutic exercises, Therapeutic activity, Neuromuscular re-education, Balance training, Gait training, Patient/Family education, Self Care, Joint mobilization, DME instructions, Cryotherapy, Moist heat, Ultrasound, and Manual therapy.  PLAN FOR NEXT SESSION: And water: Work on normalizing gait.  Patient has decreased weightbearing on right lower extremity and significant lateral shifting when not using a device.  Work on bilateral hip strengthening work on core stability and strengthening.  Land: Soft tissue mobilization to left and right hip.  Be aware of right hip labral tear and right pubic rami fracture.  Head and base supine core stability exercises including supine march, supine bridge, low range hip abduction, posterior pelvic tilt.  Progressed to standing weightbearing exercises as tolerated  PHYSICAL THERAPY DISCHARGE SUMMARY  Visits from Start of Care: 5  Current functional level related to goals / functional outcomes: Unknown did not return    Remaining deficits: Unknown    Education / Equipment: Unknown    Patient agrees to discharge. Patient goals were not met. Patient is being discharged due to meeting the  stated rehab goals.   Lorayne Bender PT DPT  02/13/2023, 2:33 PM

## 2023-02-20 ENCOUNTER — Inpatient Hospital Stay: Payer: BC Managed Care – PPO | Admitting: Genetic Counselor

## 2023-02-20 ENCOUNTER — Encounter: Payer: Self-pay | Admitting: Genetic Counselor

## 2023-02-20 ENCOUNTER — Inpatient Hospital Stay: Payer: BC Managed Care – PPO

## 2023-02-20 DIAGNOSIS — Z803 Family history of malignant neoplasm of breast: Secondary | ICD-10-CM

## 2023-02-20 DIAGNOSIS — Z800 Family history of malignant neoplasm of digestive organs: Secondary | ICD-10-CM

## 2023-02-20 LAB — GENETIC SCREENING ORDER

## 2023-02-20 NOTE — Progress Notes (Signed)
REFERRING PROVIDER: Patton Salles, MD [address] [place],  [place] [address]  PRIMARY PROVIDER:  Mila Palmer, MD  PRIMARY REASON FOR VISIT:  1. Family history of breast cancer     HISTORY OF PRESENT ILLNESS:   Ms. Riedell, a 57 y.o. female, was seen for a [place] cancer genetics consultation at the request of Dr. Edward Jolly due to a family history of breast cancer.  Ms. Kalafut presents to clinic today to discuss the possibility of a hereditary predisposition to cancer, to discuss genetic testing, and to further clarify her future cancer risks, as well as potential cancer risks for family members.   Ms. Almestica is a 57 y.o. female with no personal history of cancer.    CANCER HISTORY:  Oncology History   No history exists.     RISK FACTORS:  Mammogram within the last year: yes; category b density  Number of breast biopsies: 0. Colonoscopy: yes;  most recent in 2018; f/u 10 years . Hysterectomy: yes at age 5 Ovaries intact: no; BSO at age 29 Menarche was at age 20.  Nulliparous. OCP use for  0 years.  HRT use: 9 years; after BSO; estrogen  No derm screening.   Past Medical History:  Diagnosis Date   Anxiety 1999   Depression 1999   Fibula fracture    right   Pelvic fracture (HCC)    right   RA (rheumatoid arthritis) (HCC) 2013   hands, elbows, knees   Seasonal allergies    Shingles     Past Surgical History:  Procedure Laterality Date   CYSTOSCOPY N/A 02/25/2014   Procedure: CYSTOSCOPY;  Surgeon: Jacqualin Combes de Gwenevere Ghazi, MD;  Location: [facility];  Service: Gynecology;  Laterality: N/A;   ROBOTIC ASSISTED TOTAL HYSTERECTOMY Bilateral 02/25/2014   Procedure: ROBOTIC ASSISTED TOTAL HYSTERECTOMY WITH BILATERAL SALPINGOOPHARECTOMY;  Surgeon: Jacqualin Combes de Gwenevere Ghazi, MD;  Location: [facility];  Service: Gynecology;  Laterality: Bilateral;   WISDOM TOOTH EXTRACTION  2007    FAMILY HISTORY:  We obtained a detailed,  4-generation family history.  Significant diagnoses are listed below: Family History  Problem Relation Age of Onset   Breast cancer Mother 87       lumpectomy, no other treatment   Breast cancer Paternal Aunt 10   Breast cancer Maternal Grandmother 47   Liver cancer Maternal Grandmother 85    Ms. Gebhard is unaware of previous family history of genetic testing for hereditary cancer risks.  Affected relatives/other relatives are unavailable for genetic testing at this time. There is no reported Ashkenazi Jewish ancestry. There is no known consanguinity.  GENETIC COUNSELING ASSESSMENT: Ms. Saldierna is a 57 y.o. female with a family history which is somewhat suggestive of a hereditary cancer syndrome and predisposition to cancer given the presence of breast cancer at young ages in the family. We, therefore, discussed and recommended the following at today's visit.   DISCUSSION: We discussed that 5 - 10% of cancer is hereditary.  Most cases of hereditary breast cancer are associated with mutations in BRCA1/2.  There are other genes that can be associated with hereditary breast cancer syndromes.  We discussed that testing is beneficial for several reasons including knowing how to follow individuals for their cancer risks, identifying appropriate surveillance and risk-reducing options, and understanding if other family members could be at risk for cancer and allowing them to undergo genetic testing.   We reviewed the characteristics, features and  inheritance patterns of hereditary cancer syndromes. We also discussed genetic testing, including the appropriate family members to test, the process of testing, insurance coverage and turn-around-time for results. We discussed the implications of a negative, positive, carrier and/or variant of uncertain significant result. We recommended Ms. Neira pursue genetic testing for a panel that includes genes associated with breast cancer and other cancer.   Ms. Noeth   was offered a common hereditary cancer panel (47 genes) and an expanded pan-cancer panel (71 genes). Ms. Hardinger was informed of the benefits and limitations of each panel, including that expanded pan-cancer panels contain genes that do not have clear management guidelines at this point in time.  We also discussed that as the number of genes included on a panel increases, the chances of variants of uncertain significance increases.  After considering the benefits and limitations of each gene panel, Ms. Jedlicka  elected to have expanded pan-cancer panel through [employer] [employer]  The CancerNext-Expanded gene panel offered by [facility] and includes sequencing, rearrangement, and RNA analysis for the following 71 genes:  AIP, ALK, APC, ATM, BAP1, BARD1, BMPR1A, BRCA1, BRCA2, BRIP1, CDC73, CDH1, CDK4, CDKN1B, CDKN2A, CHEK2, DICER1, FH, FLCN, KIF1B, LZTR1,MAX, MEN1, MET, MLH1, MSH2, MSH6, MUTYH, NF1, NF2, NTHL1, PALB2, PHOX2B, PMS2, POT1, PRKAR1A, PTCH1, PTEN, RAD51C,RAD51D, RB1, RET, SDHA, SDHAF2, SDHB, SDHC, SDHD, SMAD4, SMARCA4, SMARCB1, SMARCE1, STK11, SUFU, TMEM127, TP53,TSC1, TSC2 and VHL (sequencing and deletion/duplication); AXIN2, CTNNA1, EGFR, EGLN1, HOXB13, KIT, MITF, MSH3, PDGFRA, POLD1 and POLE (sequencing only); EPCAM and GREM1 (deletion/duplication only).   Based on Ms. Kijowski's family history of breast cancer, she meets medical criteria for genetic testing. Despite that she meets criteria, she may still have an out of pocket cost. We discussed that if her out of pocket cost for testing is over $100, the laboratory should contact her and discuss the self-pay prices and/or patient pay assistance programs.    We discussed the Genetic Information Non-Discrimination Act (GINA) of 2008, which helps protect individuals against genetic discrimination based on their genetic test results.  It impacts both health insurance and employment.  With health insurance, it protects against genetic test results being  used for increased premiums or policy termination. For employment, it protects against hiring, firing and promoting decisions based on genetic test results.  GINA does not apply to those in the [employer], those who work for companies with less than 15 employees, and new life insurance or long-term disability insurance policies.  Health status due to a cancer diagnosis is not protected under GINA.  PLAN: After considering the risks, benefits, and limitations, Ms. Renderos provided informed consent to pursue genetic testing and the blood sample was sent to [facility] for analysis of the CancerNext-Expanded +RNAinsight. Results should be available within approximately 3 weeks' time, at which point they will be disclosed by telephone to Ms. Colglazier, as will any additional recommendations warranted by these results. Ms. Brandis will receive a summary of her genetic counseling visit and a copy of her results once available. This information will also be available in Epic.    Ms. Cage questions were answered to her satisfaction today. Our contact information was provided should additional questions or concerns arise. Thank you for the referral and allowing [place] to share in the care of your patient.   Xylina Rhoads M. Rennie Plowman, MS, [facility] Genetic Counselor [email or web address] (P) [phone]  The patient was seen for a total of 20 minutes in face-to-face genetic counseling.  The patient was seen alone.  Drs. Pamelia Hoit and/or Mosetta Putt  were available to discuss this case as needed.    _______________________________________________________________________ For Office Staff:  Number of people involved in session: 1 Was an Intern/ student involved with case: no

## 2023-02-23 ENCOUNTER — Ambulatory Visit (HOSPITAL_BASED_OUTPATIENT_CLINIC_OR_DEPARTMENT_OTHER): Payer: BC Managed Care – PPO | Admitting: Physical Therapy

## 2023-02-23 ENCOUNTER — Ambulatory Visit (HOSPITAL_BASED_OUTPATIENT_CLINIC_OR_DEPARTMENT_OTHER): Payer: Self-pay | Admitting: Physical Therapy

## 2023-03-02 ENCOUNTER — Encounter (HOSPITAL_BASED_OUTPATIENT_CLINIC_OR_DEPARTMENT_OTHER): Payer: BC Managed Care – PPO | Admitting: Physical Therapy

## 2023-03-15 ENCOUNTER — Telehealth: Payer: Self-pay | Admitting: Genetic Counselor

## 2023-03-15 ENCOUNTER — Encounter: Payer: Self-pay | Admitting: Genetic Counselor

## 2023-03-15 DIAGNOSIS — Z1379 Encounter for other screening for genetic and chromosomal anomalies: Secondary | ICD-10-CM | POA: Insufficient documentation

## 2023-03-15 NOTE — Telephone Encounter (Signed)
Contacted patient in attempt to disclose results of genetic testing.  LVM with contact information requesting a call back.  

## 2023-03-17 NOTE — Telephone Encounter (Signed)
Contacted patient in attempt to disclose results of genetic testing.  LVM with contact information requesting a call back.  Second attempt.  

## 2023-05-18 ENCOUNTER — Telehealth: Payer: Self-pay | Admitting: Genetic Counselor

## 2023-05-18 NOTE — Telephone Encounter (Signed)
Contacted patient in attempt to disclose results of genetic testing.  LVM with contact information requesting a call back.  Third attempt.

## 2023-05-26 ENCOUNTER — Ambulatory Visit: Payer: Self-pay | Admitting: Genetic Counselor

## 2023-05-26 DIAGNOSIS — Z1379 Encounter for other screening for genetic and chromosomal anomalies: Secondary | ICD-10-CM

## 2023-05-26 DIAGNOSIS — Z9189 Other specified personal risk factors, not elsewhere classified: Secondary | ICD-10-CM

## 2023-05-26 DIAGNOSIS — Z803 Family history of malignant neoplasm of breast: Secondary | ICD-10-CM

## 2023-05-26 NOTE — Progress Notes (Signed)
HPI:   Jenny Bailey was previously seen in the [facility] due to a family history of breast cancer and concerns regarding a hereditary predisposition to cancer.    Jenny Bailey recent genetic test results were disclosed to Jenny Bailey by MyChart message after several unsuccessful phone attempts. These results and recommendations are discussed in more detail below.  CANCER HISTORY:  Oncology History   No history exists.    FAMILY HISTORY:  We obtained a detailed, 4-generation family history.  Significant diagnoses are listed below:      Family History  Problem Relation Age of Onset   Breast cancer Mother 21        lumpectomy, no other treatment   Breast cancer Paternal Aunt 26   Breast cancer Maternal Grandmother 9   Liver cancer Maternal Grandmother 88     Jenny Bailey is unaware of previous family history of genetic testing for hereditary cancer risks.  Affected relatives/other relatives are unavailable for genetic testing at this time. There is no reported Ashkenazi Jewish ancestry. There is no known consanguinity.  GENETIC TEST RESULTS:  The [facility] CancerNext-Expanded +RNAinsight Panel found no pathogenic mutations.   The CancerNext-Expanded gene panel offered by [facility] and includes sequencing, rearrangement, and RNA analysis for the following 71 genes:  AIP, ALK, APC, ATM, BAP1, BARD1, BMPR1A, BRCA1, BRCA2, BRIP1, CDC73, CDH1, CDK4, CDKN1B, CDKN2A, CHEK2, DICER1, FH, FLCN, KIF1B, LZTR1,MAX, MEN1, MET, MLH1, MSH2, MSH6, MUTYH, NF1, NF2, NTHL1, PALB2, PHOX2B, PMS2, POT1, PRKAR1A, PTCH1, PTEN, RAD51C,RAD51D, RB1, RET, SDHA, SDHAF2, SDHB, SDHC, SDHD, SMAD4, SMARCA4, SMARCB1, SMARCE1, STK11, SUFU, TMEM127, TP53,TSC1, TSC2 and VHL (sequencing and deletion/duplication); AXIN2, CTNNA1, EGFR, EGLN1, HOXB13, KIT, MITF, MSH3, PDGFRA, POLD1 and POLE (sequencing only); EPCAM and GREM1 (deletion/duplication only).  .   The test report has been scanned into EPIC and is located  under the Molecular Pathology section of the Results Review tab.  A portion of the result report is included below for reference. Genetic testing reported out on Mar 09, 2023.     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  At this time, it is unknown if this variant is associated with an increased risk for cancer or if it is benign, but most uncertain variants are reclassified to benign. It should not be used to make medical management decisions. With time, we suspect the laboratory will determine the significance of this variant, if any. If the laboratory reclassifies this variant, we will attempt to contact Jenny Bailey to discuss it further.   Even though a pathogenic variant was not identified, possible explanations for the cancer in the family may include: There may be no hereditary risk for cancer in the family. The cancers in Jenny Bailey family may be sporadic/familial or due to other genetic and environmental factors. There may be a gene mutation in one of these genes that current testing methods cannot detect but that chance is small. There could be another gene that has not yet been discovered, or that we have not yet tested, that is responsible for the cancer diagnoses in the family.  It is also possible there is a hereditary cause for the cancer in the family that Jenny Bailey did not inherit.   Therefore, it is important to remain in touch with cancer genetics in the future so that we can continue to offer Jenny Bailey the most up to date genetic testing.    ADDITIONAL GENETIC TESTING:  Jenny Bailey genetic testing was fairly extensive.  If there are additional relevant genes identified to increase cancer risk that can be analyzed in the future, we would be happy to discuss and coordinate this testing at that time.     CANCER SCREENING RECOMMENDATIONS:  Jenny Bailey test result is considered negative (normal).  This  means that we have not identified a hereditary cause for Jenny Bailey family history of cancer at this time.   An individual's cancer risk and medical management are not determined by genetic test results alone. Overall cancer risk assessment incorporates additional factors, including personal medical history, family history, and any available genetic information that may result in a personalized plan for cancer prevention and surveillance. Therefore, it is recommended she continue to follow the cancer management and screening guidelines provided by Jenny Bailey primary healthcare provider.   Jenny Bailey has been determined to be at high risk for breast cancer.  Jenny Bailey lifetime risk for breast cancer based on Tyrer-Cuzick risk model is 21.1%.  This risk estimate can change over time and may be repeated to reflect new information in Jenny Bailey personal or family history in the future.  For females with a greater than 20% lifetime risk of breast cancer, the [employer] (NCCN) recommends the following:  Clinical encounter every 6-12 months to begin when identified as being at increased risk, but not before age 61  Annual mammograms. Tomosynthesis is recommended starting 10 years earlier than the youngest breast cancer diagnosis in the family or at age 48 (whichever comes first), but not before age 75   Annual breast MRI starting 10 years earlier than the youngest breast cancer diagnosis in the family or at age 57 (whichever comes first), but not before age 23      In addition to a yearly mammogram and physical exam by a healthcare provider, she should discuss the usefulness of an annual breast MRI with Jenny Bailey referring provider.  A referral to the high risk breast clinic at the [facility] is available if she is interested.     RECOMMENDATIONS FOR FAMILY MEMBERS:   Individuals in this family might be at some increased risk of developing cancer, over the general population risk, due to the family history of  cancer.  Individuals in the family should notify their providers of the family history of cancer. We recommend women in this family have a yearly mammogram beginning at age 15, or 7 years younger than the earliest onset of cancer, an annual clinical breast exam, and perform monthly breast self-exams.  Risk models that take into account family history and hormonal history may be helpful in determining appropriate breast cancer screening options for family members.  Other members of the family may still carry a pathogenic variant in one of these genes that Jenny Bailey did not inherit. Based on the family history, we recommend Jenny Bailey mother and paternal cousin (daughter of aunt with breast cancer in 23s) have genetic counseling and testing. Jenny Bailey can let us know if we can be of any assistance in coordinating genetic counseling and/or testing for these family member.   We do not recommend familial testing for the MET variant of uncertain significance (VUS).  FOLLOW-UP:  Cancer genetics is a rapidly advancing field and it is possible that new genetic tests will be appropriate for Jenny Bailey and/or Jenny Bailey family members in the future. We encourage Jenny Bailey to remain in contact with cancer genetics, so we can update Jenny Bailey personal and family histories and let  Jenny Bailey know of advances in cancer genetics that may benefit this family.   Our contact number was provided.  She is welcome to call us at anytime with additional questions or concerns.   Carlene Bickley M. Rennie Plowman, MS, [facility] Genetic Counselor [email or web address] (P) [phone]

## 2023-07-12 ENCOUNTER — Telehealth: Payer: BC Managed Care – PPO | Admitting: Physician Assistant

## 2023-07-12 DIAGNOSIS — B029 Zoster without complications: Secondary | ICD-10-CM | POA: Diagnosis not present

## 2023-07-12 MED ORDER — GABAPENTIN 300 MG PO CAPS: 300.0000 mg | ORAL_CAPSULE | Freq: Two times a day (BID) | ORAL | 0 refills | Status: DC

## 2023-07-12 MED ORDER — VALACYCLOVIR HCL 1 G PO TABS: 1000.0000 mg | ORAL_TABLET | Freq: Three times a day (TID) | ORAL | 0 refills | Status: AC

## 2023-07-12 NOTE — Patient Instructions (Signed)
Jenny Bailey, thank you for joining [name], PA-C for today's virtual visit.  While this provider is not your primary care provider (PCP), if your PCP is located in our provider database this encounter information will be shared with them immediately following your visit.   A [facility] MyChart account gives you access to today's visit and all your visits, tests, and labs performed at [facility] " click here if you don't have a [facility] MyChart account or go to [email or web address]  Consent: (Patient) Jenny Bailey provided verbal consent for this virtual visit at the beginning of the encounter.  Current Medications:  Current Outpatient Medications:    busPIRone (BUSPAR) 15 MG tablet, Take 15 mg by mouth., Disp: , Rfl:    cholecalciferol (VITAMIN D) 1000 UNITS tablet, Take 1,000 Units by mouth daily., Disp: , Rfl:    cyclobenzaprine (FLEXERIL) 10 MG tablet, Take 10 mg by mouth at bedtime as needed., Disp: , Rfl:    estradiol (ESTRACE) 1 MG tablet, Take 1 tablet (1 mg total) by mouth daily., Disp: 90 tablet, Rfl: 3   fluticasone (FLONASE) 50 MCG/ACT nasal spray, Place 1 spray into both nostrils daily., Disp: , Rfl:    folic acid (FOLVITE) 1 MG tablet, Take 1 mg by mouth., Disp: , Rfl:    HUMIRA PEN 40 MG/0.4ML PNKT, , Disp: , Rfl:    hydroxychloroquine (PLAQUENIL) 200 MG tablet, Take 400 mg by mouth daily. , Disp: , Rfl:    HYRIMOZ 40 MG/0.4ML SOAJ, Inject into the skin., Disp: , Rfl:    lamoTRIgine (LAMICTAL) 150 MG tablet, Take 1 tablet by mouth daily., Disp: , Rfl:    LORazepam (ATIVAN) 0.5 MG tablet, Take 0.5 mg by mouth as needed., Disp: , Rfl:    meloxicam (MOBIC) 15 MG tablet, Take 15 mg by mouth daily., Disp: , Rfl:    methotrexate (RHEUMATREX) 2.5 MG tablet, Take 25 mg by mouth once a week. Every Sunday.  Takes 10 of the 2.5 mg tablets to = 25 mg dose, Disp: , Rfl:    Methotrexate (XATMEP PO), methotrexate, Disp: , Rfl:    Multiple Vitamin  (MULTIVITAMIN) tablet, Take 1 tablet by mouth daily., Disp: , Rfl:    Omega-3 Fatty Acids (FISH OIL) 1000 MG CAPS, 1 capsule Orally Once a day, Disp: , Rfl:    predniSONE (STERAPRED UNI-PAK 21 TAB) 10 MG (21) TBPK tablet, Take as directed, Disp: 21 tablet, Rfl: 0   sertraline (ZOLOFT) 50 MG tablet, Take 150 mg by mouth daily. 3 tabs daily, Disp: , Rfl:    traMADol (ULTRAM) 50 MG tablet, Take by mouth as needed., Disp: , Rfl:    vitamin E 45 MG (100 UNITS) capsule, 1 capsule Orally Once a day, Disp: , Rfl:    Medications ordered in this encounter:  No orders of the defined types were placed in this encounter.    *If you need refills on other medications prior to your next appointment, please contact your pharmacy*  Follow-Up: Call back or seek an in-person evaluation if the symptoms worsen or if the condition fails to improve as anticipated.  [facility] [phone]  Other Instructions Take the Valtrex as directed. Keep the skin clean and dry.  Isolate until your rash crusts over.  Use the Gabapentin as directed for nerve pain. Can use OTC tylenol for milder pain.  Shingles  Shingles is an infection. It gives you a painful skin rash and blisters that have fluid in them.  Shingles is caused by the same germ (virus) that causes chickenpox. Shingles only happens in people who: Have had chickenpox. Have been given a shot (vaccine) to protect against chickenpox. Shingles is rare in this group. What are the causes? This condition is caused by varicella-zoster virus. This is the same germ that causes chickenpox. After a person is exposed to the germ, the germ stays in the body but is not active (dormant). Shingles develops if the germ becomes active again (is reactivated). This can happen many years after the first exposure to the germ. It is not known what causes this germ to become active again. What increases the risk? People who have had chickenpox or received the  chickenpox shot are at risk for shingles. This infection is more common in people who: Are older than 56 years of age. Have a weakened disease-fighting system (immune system), such as people with: HIV (human immunodeficiency virus). AIDS (acquired immunodeficiency syndrome). Cancer. Are taking medicines that weaken the immune system, such as organ transplant medicines. Have a lot of stress. What are the signs or symptoms? The first symptoms of shingles may be itching, tingling, or pain in an area on your skin. A rash will show on your skin a few days or weeks later. This is what usually happens: The rash is likely to be on one side of your body. The rash usually has a shape like a belt or a band. Over time, the rash turns into fluid-filled blisters. The blisters will break open and change into scabs. The scabs usually dry up in about 2-3 weeks. You may also have: A fever. Chills. A headache. A feeling like you may vomit (nausea). How is this treated? The rash may last for several weeks. There is not a specific cure for this condition. Your doctor may prescribe medicines. Medicines may: Help with pain. Help you get better sooner. Help to prevent long-term problems. Help with itching (antihistamines). If the area involved is on your face, you may need to see a specialist. This may be an eye doctor or an ear, nose, and throat (ENT) doctor. Follow these instructions at home: Medicines Take over-the-counter and prescription medicines only as told by your doctor. Put on an anti-itch cream or numbing cream where you have a rash, blisters, or scabs. Do this as told by your doctor. Helping with itching and discomfort  Put cold, wet cloths (cold compresses) on the area of the rash or blisters as told by your doctor. Cool baths can help you feel better. Try adding baking soda or dry oatmeal to the water to lessen itching. Do not bathe in hot water. Use calamine lotion as told by your  doctor. Blister and rash care Keep your rash covered with a loose bandage (dressing). Wear loose clothing that does not rub on your rash. Wash your hands with soap and water for at least 20 seconds before and after you change your bandage. If you cannot use soap and water, use hand sanitizer. Change your bandage as told by your doctor. Keep your rash and blisters clean. To do this, wash the area with mild soap and cool water as told by your doctor. Check your rash every day for signs of infection. Check for: More redness, swelling, or pain. Fluid or blood. Warmth. Pus or a bad smell. Do not scratch your rash. Do not pick at your blisters. To help you to not scratch: Keep your fingernails clean and cut short. Wear gloves or mittens when you sleep,  if scratching is a problem. General instructions Rest as told by your doctor. Wash your hands often with soap and water for at least 20 seconds. If you cannot use soap and water, use hand sanitizer. Doing this lowers your chance of getting a skin infection. Your infection can cause chickenpox in people who have never had chickenpox or never got a chickenpox vaccine shot. If you have blisters that did not change into scabs yet, try not to touch other people or be around other people, especially: Babies. Pregnant women. Children who have areas of red, itchy, or rough skin (eczema). Older people who have organ transplants. People who have a long-term (chronic) illness, like cancer or AIDS. Keep all follow-up visits. How is this prevented? A vaccine shot is the best way to prevent shingles and protect against shingles problems. If you have not had a vaccine shot, talk with your doctor about getting it. Where to find more information Centers for Disease Control and Prevention: [employer].com.br Contact a doctor if: Your pain does not get better with medicine. Your pain does not get better after the rash heals. You have any of these signs of infection  around the rash: More redness, swelling, or pain. Fluid or blood. Warmth. Pus or a bad smell. You have a fever. Get help right away if: The rash is on your face or nose. You have pain in your face or pain by your eye. You lose feeling on one side of your face. You have trouble seeing. You have ear pain, or you have ringing in your ear. You have a loss of taste. Your condition gets worse. Summary Shingles gives you a painful skin rash and blisters that have fluid in them. Shingles is caused by the same germ (virus) that causes chickenpox. Keep your rash covered with a loose bandage. Wear loose clothing that does not rub on your rash. If you have blisters that did not change into scabs yet, try not to touch other people or be around people. This information is not intended to replace advice given to you by your health care provider. Make sure you discuss any questions you have with your health care provider. Document Revised: 09/28/2020 Document Reviewed: 09/28/2020 Elsevier Patient Education  2024 Elsevier Inc.    If you have been instructed to have an in-person evaluation today at a local Urgent Care facility, please use the link below. It will take you to a list of all of our available [facility], including address, phone number and hours of operation. Please do not delay care.  [facility]  If you or a family member do not have a primary care provider, use the link below to schedule a visit and establish care. When you choose a [facility] primary care physician or advanced practice provider, you gain a long-term partner in health. Find a Primary Care Provider  Learn more about [facility]'s in-office and virtual care options: [facility] - Get Care Now

## 2023-07-12 NOTE — Progress Notes (Signed)
Virtual Visit Consent   Jenny Bailey, you are scheduled for a virtual visit with a [facility] provider today. Just as with appointments in the office, your consent must be obtained to participate. Your consent will be active for this visit and any virtual visit you may have with one of our providers in the next 365 days. If you have a MyChart account, a copy of this consent can be sent to you electronically.  As this is a virtual visit, video technology does not allow for your provider to perform a traditional examination. This may limit your provider's ability to fully assess your condition. If your provider identifies any concerns that need to be evaluated in person or the need to arrange testing (such as labs, EKG, etc.), we will make arrangements to do so. Although advances in technology are sophisticated, we cannot ensure that it will always work on either your end or our end. If the connection with a video visit is poor, the visit may have to be switched to a telephone visit. With either a video or telephone visit, we are not always able to ensure that we have a secure connection.  By engaging in this virtual visit, you consent to the provision of healthcare and authorize for your insurance to be billed (if applicable) for the services provided during this visit. Depending on your insurance coverage, you may receive a charge related to this service.  I need to obtain your verbal consent now. Are you willing to proceed with your visit today? Jenny Bailey has provided verbal consent on 07/12/2023 for a virtual visit (video or telephone). Jenny Bailey, [place]  Date: 07/12/2023 5:32 PM  Virtual Visit via Video Note   I, Jenny Bailey, connected with  Tarla Fryar  ([id number], 03/13/1966) on 07/12/23 at  5:30 PM EDT by a video-enabled telemedicine application and verified that I am speaking with the correct person using two identifiers.  Location: Patient: Virtual Visit Location Patient:  Home Provider: Virtual Visit Location Provider: Home Office   I discussed the limitations of evaluation and management by telemedicine and the availability of in person appointments. The patient expressed understanding and agreed to proceed.    History of Present Illness: Jenny Bailey is a 57 y.o. who identifies as a female who was assigned female at birth, and is being seen today for possible shingles rash. Patient endorses a day or two of a burning, stingling and itchy vesicular rash of L cheek and lower face. Concerned for shingles as had it in this same area back in 2020. Denies fever, chills. Denies similar rash elsewhere. Denies change to soaps, lotions or detergents. Denies ear pain, hearing loss, vision changes or eye pain.  HPI: HPI  Problems:  Patient Active Problem List   Diagnosis Date Noted   At high risk for breast cancer 05/26/2023   Genetic testing 03/15/2023   Family history of breast cancer 02/20/2023   Pain in joint of right hip 11/28/2022   Closed fracture of lateral malleolus of right fibula 02/08/2022   Arthritis, rheumatoid (HCC) 06/16/2015   Post-menopause on HRT (hormone replacement therapy) 06/16/2015   Anxiety and depression 06/16/2015    Allergies: No Known Allergies Medications:  Current Outpatient Medications:    gabapentin (NEURONTIN) 300 MG capsule, Take 1 capsule (300 mg total) by mouth 2 (two) times daily., Disp: 15 capsule, Rfl: 0   valACYclovir (VALTREX) 1000 MG tablet, Take 1 tablet (1,000 mg total) by mouth 3 (three) times daily for 7  days., Disp: 21 tablet, Rfl: 0   busPIRone (BUSPAR) 15 MG tablet, Take 15 mg by mouth., Disp: , Rfl:    cholecalciferol (VITAMIN D) 1000 UNITS tablet, Take 1,000 Units by mouth daily., Disp: , Rfl:    estradiol (ESTRACE) 1 MG tablet, Take 1 tablet (1 mg total) by mouth daily., Disp: 90 tablet, Rfl: 3   fluticasone (FLONASE) 50 MCG/ACT nasal spray, Place 1 spray into both nostrils daily., Disp: , Rfl:    folic acid  (FOLVITE) 1 MG tablet, Take 1 mg by mouth., Disp: , Rfl:    HUMIRA PEN 40 MG/0.4ML PNKT, , Disp: , Rfl:    hydroxychloroquine (PLAQUENIL) 200 MG tablet, Take 400 mg by mouth daily. , Disp: , Rfl:    HYRIMOZ 40 MG/0.4ML SOAJ, Inject into the skin., Disp: , Rfl:    lamoTRIgine (LAMICTAL) 150 MG tablet, Take 1 tablet by mouth daily., Disp: , Rfl:    LORazepam (ATIVAN) 0.5 MG tablet, Take 0.5 mg by mouth as needed., Disp: , Rfl:    meloxicam (MOBIC) 15 MG tablet, Take 15 mg by mouth daily., Disp: , Rfl:    methotrexate (RHEUMATREX) 2.5 MG tablet, Take 25 mg by mouth once a week. Every Sunday.  Takes 10 of the 2.5 mg tablets to = 25 mg dose, Disp: , Rfl:    Methotrexate (XATMEP PO), methotrexate, Disp: , Rfl:    Multiple Vitamin (MULTIVITAMIN) tablet, Take 1 tablet by mouth daily., Disp: , Rfl:    Omega-3 Fatty Acids (FISH OIL) 1000 MG CAPS, 1 capsule Orally Once a day, Disp: , Rfl:    sertraline (ZOLOFT) 50 MG tablet, Take 150 mg by mouth daily. 3 tabs daily, Disp: , Rfl:    traMADol (ULTRAM) 50 MG tablet, Take by mouth as needed., Disp: , Rfl:    vitamin E 45 MG (100 UNITS) capsule, 1 capsule Orally Once a day, Disp: , Rfl:   Observations/Objective: Patient is well-developed, well-nourished in no acute distress.  Resting comfortably  at home.  Head is normocephalic, atraumatic.  No labored breathing.  Speech is clear and coherent with logical content.  Patient is alert and oriented at baseline.  Erythematous vesicular rash noted of left lower jaw and cheek. Does not extend upward to periorbital region or to the ear itself  Assessment and Plan: 1. Herpes zoster without complication - valACYclovir (VALTREX) 1000 MG tablet; Take 1 tablet (1,000 mg total) by mouth 3 (three) times daily for 7 days.  Dispense: 21 tablet; Refill: 0 - gabapentin (NEURONTIN) 300 MG capsule; Take 1 capsule (300 mg total) by mouth 2 (two) times daily.  Dispense: 15 capsule; Refill: 0  Supportive measures and OTC  medications reviewed. Valtrex and Gabapentin per orders. Giving facial involvement, strict ER precautions reviewed with patient who voiced understanding that if the rash is getting close to her eye, she notes any eye pain, vision change, ear pain or hearing change, she is to proceed to nearest ER ASAP.  Follow Up Instructions: I discussed the assessment and treatment plan with the patient. The patient was provided an opportunity to ask questions and all were answered. The patient agreed with the plan and demonstrated an understanding of the instructions.  A copy of instructions were sent to the patient via MyChart unless otherwise noted below.   The patient was advised to call back or seek an in-person evaluation if the symptoms worsen or if the condition fails to improve as anticipated.  Time:  I spent 10 minutes with  the patient via telehealth technology discussing the above problems/concerns.    Jenny Climes, PA-C

## 2023-09-29 ENCOUNTER — Encounter: Payer: Self-pay | Admitting: Obstetrics and Gynecology

## 2023-10-19 ENCOUNTER — Encounter: Payer: Self-pay | Admitting: Podiatry

## 2023-10-19 ENCOUNTER — Ambulatory Visit: Payer: BC Managed Care – PPO | Admitting: Podiatry

## 2023-10-19 DIAGNOSIS — S90421A Blister (nonthermal), right great toe, initial encounter: Secondary | ICD-10-CM | POA: Diagnosis not present

## 2023-10-19 DIAGNOSIS — L603 Nail dystrophy: Secondary | ICD-10-CM | POA: Diagnosis not present

## 2023-10-19 NOTE — Progress Notes (Signed)
  Subjective:  Patient ID: Jenny Bailey, female    DOB: 12-Jan-1966,   MRN: [id number]  No chief complaint on file.   58 y.o. female presents for concern of blister on her right great toe as well as black discoloration on her second toe and changes on her right great toe. Does relates increasing activity lately and blister formed after this. Also relates a history of injuring the great toe about a year ago.  Has a history of RA.  SABRA Denies any other pedal complaints. Denies n/v/f/c.   Past Medical History:  Diagnosis Date   Anxiety 1999   Depression 1999   Fibula fracture    right   Pelvic fracture (HCC)    right   RA (rheumatoid arthritis) (HCC) 2013   hands, elbows, knees   Seasonal allergies    Shingles     Objective:  Physical Exam: Vascular: DP/PT pulses 2/4 bilateral. CFT <3 seconds. Normal hair growth on digits. No edema.  Skin. No lacerations or abrasions bilateral feet. Blister noted to medial right hallux upon lancing no open ulcerations noted. Distal right great toenail with extra previous nail noted that is growing out appropriately. Right second digit with black discoloration and dystrophic changes Musculoskeletal: MMT 5/5 bilateral lower extremities in DF, PF, Inversion and Eversion. Deceased ROM in DF of ankle joint.  Neurological: Sensation intact to light touch.   Assessment:   1. Onychodystrophy   2. Blister of great toe of right foot, initial encounter      Plan:  Patient was evaluated and treated and all questions answered. -Examined patient -Discussed treatment options for painful dystrophic nails and blisters.  -Discussed this damage is all likely from walking and injury to the nails and should improve as nails continue to grow out.  -Blister lanced as courtesy without incident and covered with neosporin and a bandiad . -Discussed if any changes further in the future could consider testing for fungus.  -Patient to return as needed   Jenny Bailey, DPM

## 2023-11-23 ENCOUNTER — Telehealth: Payer: Self-pay | Admitting: Family Medicine

## 2023-11-23 DIAGNOSIS — R6889 Other general symptoms and signs: Secondary | ICD-10-CM

## 2023-11-23 MED ORDER — OSELTAMIVIR PHOSPHATE 75 MG PO CAPS: 75.0000 mg | ORAL_CAPSULE | Freq: Two times a day (BID) | ORAL | 0 refills | Status: AC

## 2023-11-23 NOTE — Progress Notes (Signed)
 Virtual Visit Consent   Jenny Bailey, you are scheduled for a virtual visit with a [facility] provider today. Just as with appointments in the office, your consent must be obtained to participate. Your consent will be active for this visit and any virtual visit you may have with one of our providers in the next 365 days. If you have a MyChart account, a copy of this consent can be sent to you electronically.  As this is a virtual visit, video technology does not allow for your provider to perform a traditional examination. This may limit your provider's ability to fully assess your condition. If your provider identifies any concerns that need to be evaluated in person or the need to arrange testing (such as labs, EKG, etc.), we will make arrangements to do so. Although advances in technology are sophisticated, we cannot ensure that it will always work on either your end or our end. If the connection with a video visit is poor, the visit may have to be switched to a telephone visit. With either a video or telephone visit, we are not always able to ensure that we have a secure connection.  By engaging in this virtual visit, you consent to the provision of healthcare and authorize for your insurance to be billed (if applicable) for the services provided during this visit. Depending on your insurance coverage, you may receive a charge related to this service.  I need to obtain your verbal consent now. Are you willing to proceed with your visit today? Jenny Bailey has provided verbal consent on 11/23/2023 for a virtual visit (video or telephone). Jenny CHRISTELLA Barefoot, NP  Date: 11/23/2023 9:01 AM  Virtual Visit via Video Note   I, Jenny Bailey, connected with  Jenny Bailey  ([id number], April 24, 1966) on 11/23/23 at  9:00 AM EST by a video-enabled telemedicine application and verified that I am speaking with the correct person using two identifiers.  Location: Patient: Virtual Visit Location Patient: Home Provider:  Virtual Visit Location Provider: Home Office   I discussed the limitations of evaluation and management by telemedicine and the availability of in person appointments. The patient expressed understanding and agreed to proceed.    History of Present Illness: Jenny Bailey is a 58 y.o. who identifies as a female who was assigned female at birth, and is being seen today for Flu  Onset was Tuesday afternoon- congestion and fatigue and joint pain Associated symptoms are sore throat, ear ache, bodyaches, headache, fever as improved Modifying factors are tylenol   Denies chest pain, shortness of breath  Exposure to sick contacts- known + Flu with co workers  Problems:  Patient Active Problem List   Diagnosis Date Noted   At high risk for breast cancer 05/26/2023   Genetic testing 03/15/2023   Family history of breast cancer 02/20/2023   Pain in joint of right hip 11/28/2022   Closed fracture of lateral malleolus of right fibula 02/08/2022   Arthritis, rheumatoid (HCC) 06/16/2015   Post-menopause on HRT (hormone replacement therapy) 06/16/2015   Anxiety and depression 06/16/2015    Allergies: No Known Allergies Medications:  Current Outpatient Medications:    busPIRone  (BUSPAR ) 15 MG tablet, Take 15 mg by mouth., Disp: , Rfl:    cholecalciferol (VITAMIN D ) 1000 UNITS tablet, Take 1,000 Units by mouth daily., Disp: , Rfl:    estradiol  (ESTRACE ) 1 MG tablet, Take 1 tablet (1 mg total) by mouth daily., Disp: 90 tablet, Rfl: 3   fluticasone  (FLONASE ) 50 MCG/ACT nasal  spray, Place 1 spray into both nostrils daily., Disp: , Rfl:    folic acid (FOLVITE) 1 MG tablet, Take 1 mg by mouth., Disp: , Rfl:    gabapentin  (NEURONTIN ) 300 MG capsule, Take 1 capsule (300 mg total) by mouth 2 (two) times daily., Disp: 15 capsule, Rfl: 0   HUMIRA PEN 40 MG/0.4ML PNKT, , Disp: , Rfl:    hydroxychloroquine  (PLAQUENIL ) 200 MG tablet, Take 400 mg by mouth daily. , Disp: , Rfl:    HYRIMOZ 40 MG/0.4ML SOAJ, Inject  into the skin., Disp: , Rfl:    lamoTRIgine  (LAMICTAL ) 150 MG tablet, Take 1 tablet by mouth daily., Disp: , Rfl:    LORazepam  (ATIVAN ) 0.5 MG tablet, Take 0.5 mg by mouth as needed., Disp: , Rfl:    meloxicam (MOBIC) 15 MG tablet, Take 15 mg by mouth daily., Disp: , Rfl:    methotrexate (RHEUMATREX) 2.5 MG tablet, Take 25 mg by mouth once a week. Every Sunday.  Takes 10 of the 2.5 mg tablets to = 25 mg dose, Disp: , Rfl:    Methotrexate (XATMEP PO), methotrexate, Disp: , Rfl:    Multiple Vitamin (MULTIVITAMIN) tablet, Take 1 tablet by mouth daily., Disp: , Rfl:    Omega-3 Fatty Acids (FISH OIL) 1000 MG CAPS, 1 capsule Orally Once a day, Disp: , Rfl:    sertraline  (ZOLOFT ) 50 MG tablet, Take 150 mg by mouth daily. 3 tabs daily, Disp: , Rfl:    traMADol  (ULTRAM ) 50 MG tablet, Take by mouth as needed., Disp: , Rfl:    vitamin E 45 MG (100 UNITS) capsule, 1 capsule Orally Once a day, Disp: , Rfl:   Observations/Objective: Patient is well-developed, well-nourished in no acute distress.  Resting comfortably  at home.  Head is normocephalic, atraumatic.  No labored breathing.  Speech is clear and coherent with logical content.  Patient is alert and oriented at baseline.    Assessment and Plan:  1. Flu-like symptoms (Primary)  - oseltamivir  (TAMIFLU ) 75 MG capsule; Take 1 capsule (75 mg total) by mouth 2 (two) times daily for 5 days.  Dispense: 10 capsule; Refill: 0  -Take medications as discussed - Increased rest - Increasing Fluids - Acetaminophen  / ibuprofen  as needed for fever/pain.  - Salt water gargling, chloraseptic spray and throat lozenges - Mucinex if mucus is present and increasing.  - Saline nasal spray if congestion or if nasal passages feel dry. - Humidifying the air.   Reviewed side effects, risks and benefits of medication.    Patient acknowledged agreement and understanding of the plan.   Past Medical, Surgical, Social History, Allergies, and Medications have been  Reviewed.     Follow Up Instructions: I discussed the assessment and treatment plan with the patient. The patient was provided an opportunity to ask questions and all were answered. The patient agreed with the plan and demonstrated an understanding of the instructions.  A copy of instructions were sent to the patient via MyChart unless otherwise noted below.     The patient was advised to call back or seek an in-person evaluation if the symptoms worsen or if the condition fails to improve as anticipated.    Jenny CHRISTELLA Barefoot, NP

## 2023-11-23 NOTE — Patient Instructions (Signed)
 Jenny Bailey, thank you for joining [name], NP for today's virtual visit.  While this provider is not your primary care provider (PCP), if your PCP is located in our provider database this encounter information will be shared with them immediately following your visit.   A [facility] MyChart account gives you access to today's visit and all your visits, tests, and labs performed at [facility]  click here if you don't have a [facility] MyChart account or go to [email or web address]  Consent: (Patient) Jenny Bailey provided verbal consent for this virtual visit at the beginning of the encounter.  Current Medications:  Current Outpatient Medications:    oseltamivir  (TAMIFLU ) 75 MG capsule, Take 1 capsule (75 mg total) by mouth 2 (two) times daily for 5 days., Disp: 10 capsule, Rfl: 0   busPIRone  (BUSPAR ) 15 MG tablet, Take 15 mg by mouth., Disp: , Rfl:    cholecalciferol (VITAMIN D ) 1000 UNITS tablet, Take 1,000 Units by mouth daily., Disp: , Rfl:    estradiol  (ESTRACE ) 1 MG tablet, Take 1 tablet (1 mg total) by mouth daily., Disp: 90 tablet, Rfl: 3   fluticasone  (FLONASE ) 50 MCG/ACT nasal spray, Place 1 spray into both nostrils daily., Disp: , Rfl:    folic acid (FOLVITE) 1 MG tablet, Take 1 mg by mouth., Disp: , Rfl:    gabapentin  (NEURONTIN ) 300 MG capsule, Take 1 capsule (300 mg total) by mouth 2 (two) times daily., Disp: 15 capsule, Rfl: 0   HUMIRA PEN 40 MG/0.4ML PNKT, , Disp: , Rfl:    hydroxychloroquine  (PLAQUENIL ) 200 MG tablet, Take 400 mg by mouth daily. , Disp: , Rfl:    HYRIMOZ 40 MG/0.4ML SOAJ, Inject into the skin., Disp: , Rfl:    lamoTRIgine  (LAMICTAL ) 150 MG tablet, Take 1 tablet by mouth daily., Disp: , Rfl:    LORazepam  (ATIVAN ) 0.5 MG tablet, Take 0.5 mg by mouth as needed., Disp: , Rfl:    meloxicam (MOBIC) 15 MG tablet, Take 15 mg by mouth daily., Disp: , Rfl:    methotrexate (RHEUMATREX) 2.5 MG tablet, Take 25 mg by mouth once a week. Every  Sunday.  Takes 10 of the 2.5 mg tablets to = 25 mg dose, Disp: , Rfl:    Methotrexate (XATMEP PO), methotrexate, Disp: , Rfl:    Multiple Vitamin (MULTIVITAMIN) tablet, Take 1 tablet by mouth daily., Disp: , Rfl:    Omega-3 Fatty Acids (FISH OIL) 1000 MG CAPS, 1 capsule Orally Once a day, Disp: , Rfl:    sertraline  (ZOLOFT ) 50 MG tablet, Take 150 mg by mouth daily. 3 tabs daily, Disp: , Rfl:    traMADol  (ULTRAM ) 50 MG tablet, Take by mouth as needed., Disp: , Rfl:    vitamin E 45 MG (100 UNITS) capsule, 1 capsule Orally Once a day, Disp: , Rfl:    Medications ordered in this encounter:  Meds ordered this encounter  Medications   oseltamivir  (TAMIFLU ) 75 MG capsule    Sig: Take 1 capsule (75 mg total) by mouth 2 (two) times daily for 5 days.    Dispense:  10 capsule    Refill:  0    Supervising Provider:   BLAISE ALEENE KIDD [[id number]]     *If you need refills on other medications prior to your next appointment, please contact your pharmacy*  Follow-Up: Call back or seek an in-person evaluation if the symptoms worsen or if the condition fails to improve as anticipated.  [facility] [phone]  Other Instructions   -Take medications as discussed - Increased rest - Increasing Fluids - Acetaminophen  / ibuprofen  as needed for fever/pain.  - Salt water gargling, chloraseptic spray and throat lozenges - Mucinex if mucus is present and increasing.  - Saline nasal spray if congestion or if nasal passages feel dry. - Humidifying the air.   If you have been instructed to have an in-person evaluation today at a local Urgent Care facility, please use the link below. It will take you to a list of all of our available [facility], including address, phone number and hours of operation. Please do not delay care.  [facility]  If you or a family member do not have a primary care provider, use the link below to schedule a visit and establish care. When  you choose a [facility] primary care physician or advanced practice provider, you gain a long-term partner in health. Find a Primary Care Provider  Learn more about [facility]'s in-office and virtual care options:  - Get Care Now

## 2023-12-18 ENCOUNTER — Other Ambulatory Visit: Payer: Self-pay

## 2023-12-18 DIAGNOSIS — N951 Menopausal and female climacteric states: Secondary | ICD-10-CM

## 2023-12-18 MED ORDER — ESTRADIOL 1 MG PO TABS: 1.0000 mg | ORAL_TABLET | Freq: Every day | ORAL | 0 refills | Status: DC

## 2023-12-18 NOTE — Telephone Encounter (Signed)
 Med refill request: Estradiol 1mg  oral tab Last AEX: 02/01/2023-BS Next AEX: nothing scheduled, recall sent per EMR. Will send msg to appt desk.  Last MMG (if hormonal med): 09/27/2023-WNL Refill authorized: rx pend.

## 2023-12-22 ENCOUNTER — Telehealth: Payer: Self-pay

## 2023-12-22 NOTE — Telephone Encounter (Signed)
 Patient called & stated she needed a refill of her estradiol.I told her the a refill was sent for 90 days with 0 refills on 12-18-23. Patient was appreciative of the information.

## 2023-12-27 NOTE — Telephone Encounter (Signed)
 Pt now scheduled for 04/25/2024

## 2024-02-27 IMAGING — CT CT ANKLE*R* W/O CM
1 series · 12 of 14 positions shown, 15 images · non-contrast
Comparison: CT right foot 02/08/2022.  No other comparison studies.

CLINICAL DATA: Status post fall. Nondisplaced lateral malleolar
fracture and possible 2nd metatarsal fracture.

EXAM:
CT OF THE RIGHT ANKLE WITHOUT CONTRAST
TECHNIQUE: Multidetector CT imaging of the right ankle was performed according
to the standard protocol. Multiplanar CT image reconstructions were
also generated.
RADIATION DOSE REDUCTION: This exam was performed according to the
departmental dose-optimization program which includes automated
exposure control, adjustment of the mA and/or kV according to
patient size and/or use of iterative reconstruction technique.

[Series 4: soft tissue lower extremity · axial · 0.40mm/px · z∈[-1101,-903]mm · 12 of 119 slices shown, 15 images]
[im 10/119  soft-tissue]
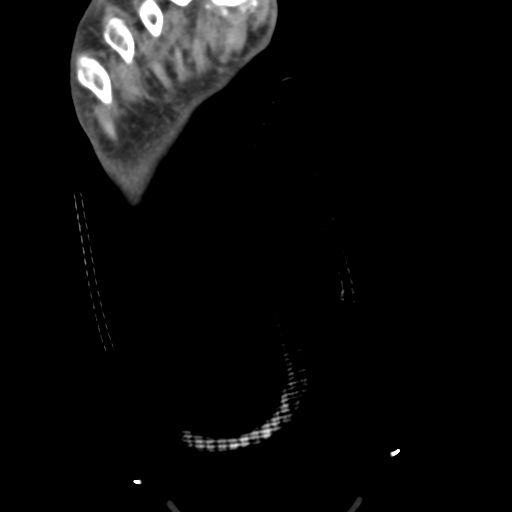
[im 10/119  bone]
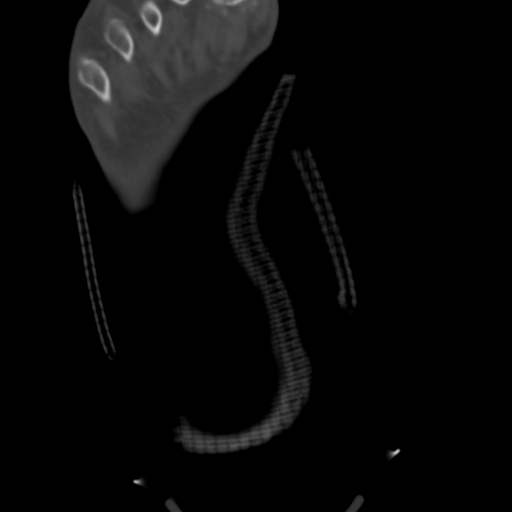
[im 19/119  bone]
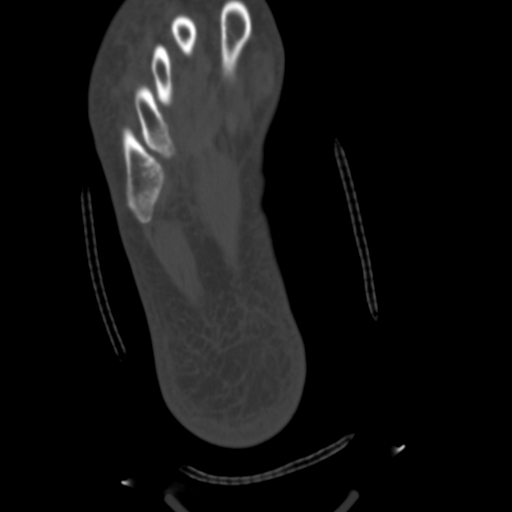
[im 28/119  bone]
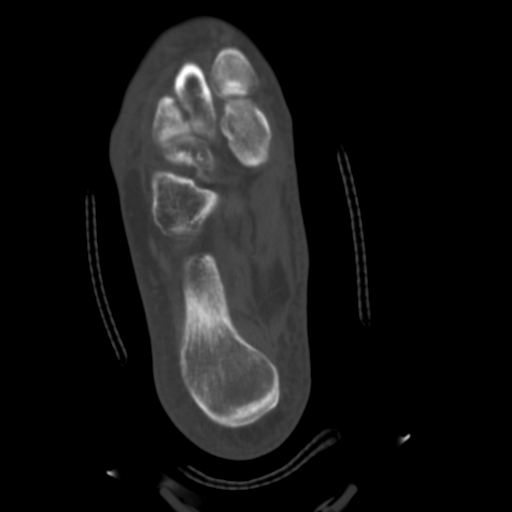
[im 37/119  bone]
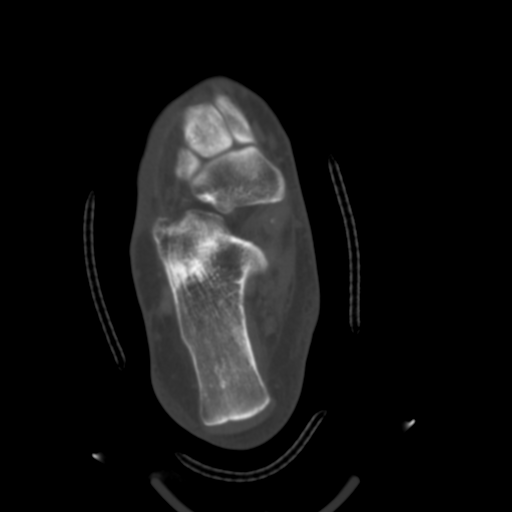
[im 46/119  soft-tissue]
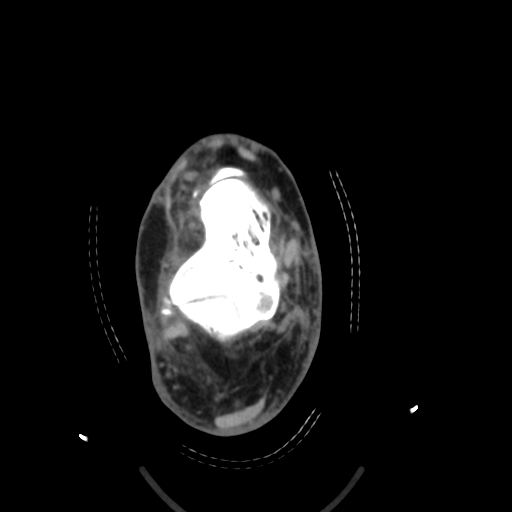
[im 46/119  bone]
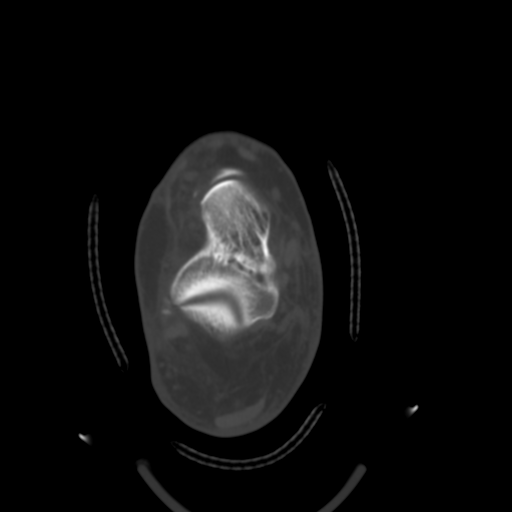
[im 55/119  bone]
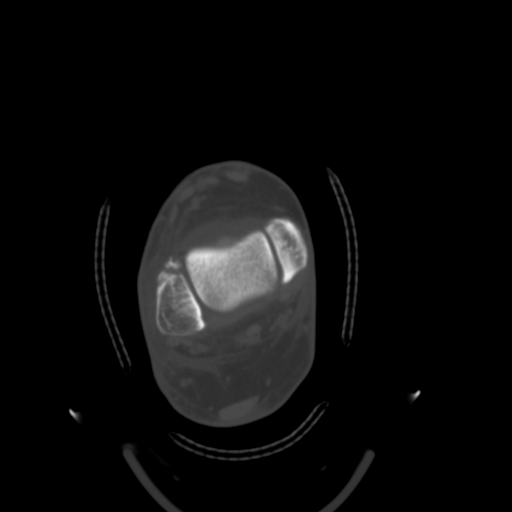
[im 64/119  bone]
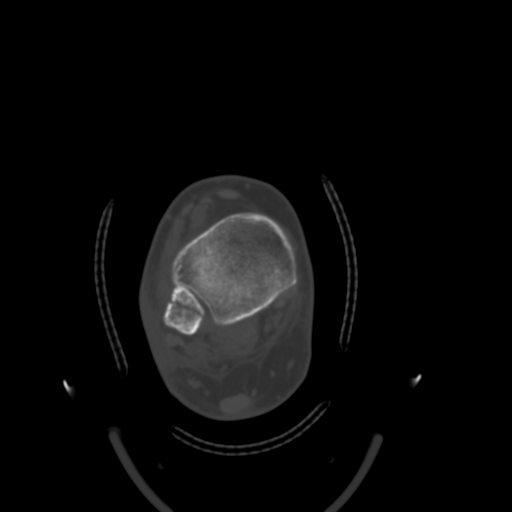
[im 73/119  bone]
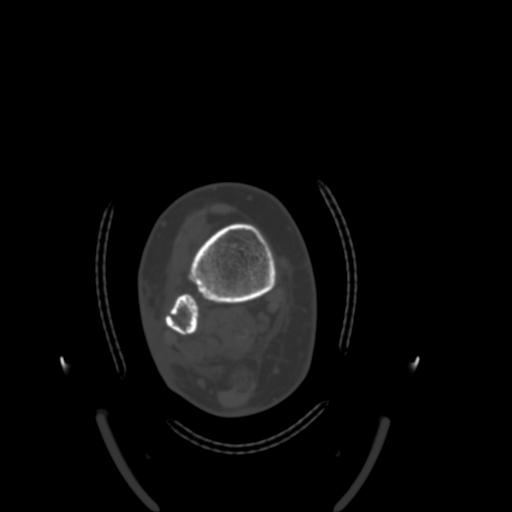
[im 82/119  soft-tissue]
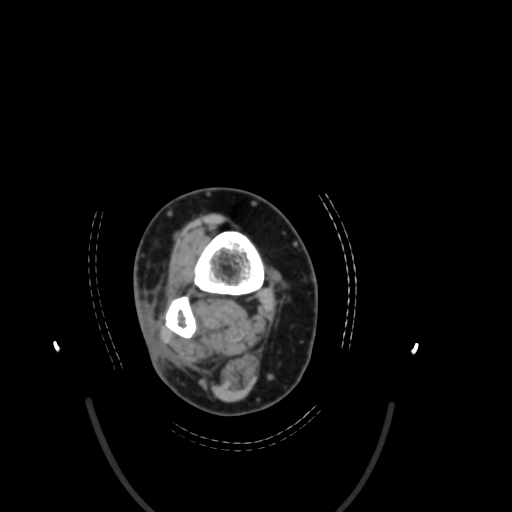
[im 82/119  bone]
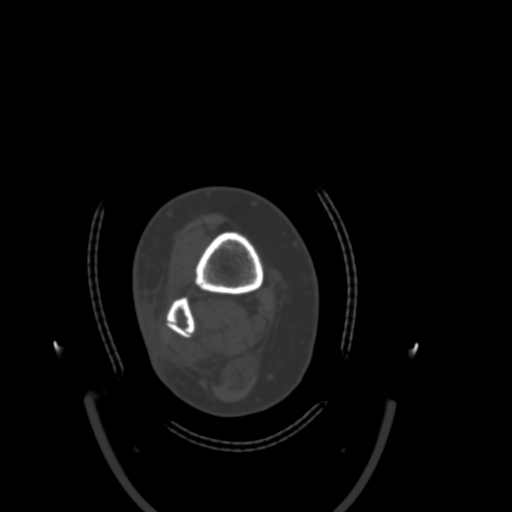
[im 91/119  bone]
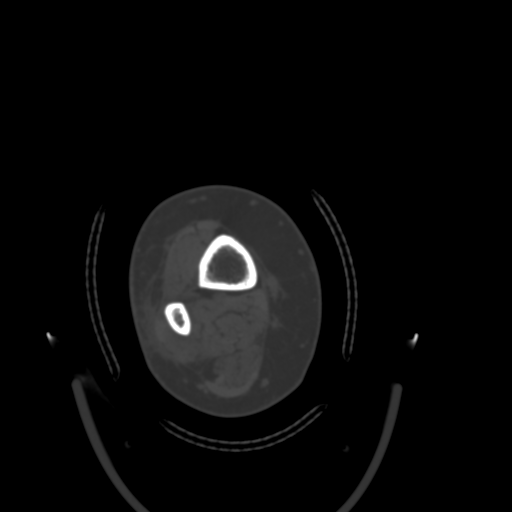
[im 100/119  bone]
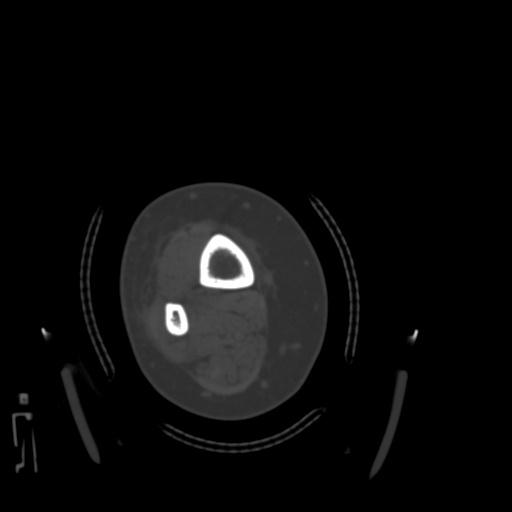
[im 109/119  bone]
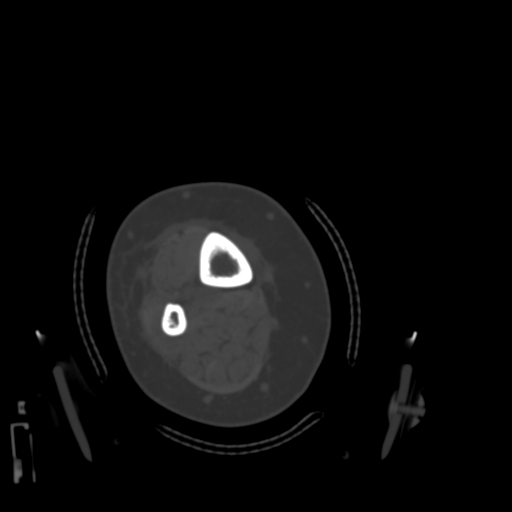

[12 of 14 positions shown; findings below may reference images not displayed]

FINDINGS: Bones/Joint/Cartilage

The ankle is splinted. Nondisplaced oblique fracture of the distal
fibular diaphysis is again noted, extending approximately 7 cm in
length. This extends into the anterior aspect of the lateral
malleolus and is minimally comminuted. No definite tibial fracture.
There is no widening of the ankle mortise. The talar dome appears
intact. The additional tarsal bones and metatarsal bases appear
intact. Mild fragmented spurring along the lateral aspect of the
talonavicular joint does not appear acute.

Ligaments

Suboptimally assessed by CT. Probable avulsion of the anterior
talofibular ligament at the fibular attachment.

Muscles and Tendons

The ankle tendons appear intact without evidence of entrapment. Type
1 accessory navicular.

Soft tissues

Mild soft tissue swelling around the ankle without focal fluid
collection, foreign body or soft tissue emphysema.
IMPRESSION: 1. Nondisplaced oblique fracture of the distal fibula, extending
into the anterior aspect of the lateral malleolus where there is
probable avulsion at the anterior talofibular ligament attachment.
2. No other acute osseous findings identified at the right ankle.
There is no widening of the ankle mortise.
3. Mild soft tissue swelling without focal hematoma or evidence of
acute tendon injury.

## 2024-03-26 ENCOUNTER — Other Ambulatory Visit: Payer: Self-pay | Admitting: Obstetrics and Gynecology

## 2024-03-26 DIAGNOSIS — N951 Menopausal and female climacteric states: Secondary | ICD-10-CM

## 2024-03-27 NOTE — Telephone Encounter (Signed)
 Med refill request: Estrace   Last AEX: 02/01/23  Next AEX: 04/25/24 Last MMG (if hormonal med) 09/27/23  birads cat 1 neg Refill authorized: last rx 12/18/23 #90 with 0 refills. Please approve or deny

## 2024-04-25 ENCOUNTER — Ambulatory Visit: Admitting: Obstetrics and Gynecology

## 2024-04-25 ENCOUNTER — Encounter: Payer: Self-pay | Admitting: Obstetrics and Gynecology

## 2024-04-25 VITALS — BP 116/78 | HR 97 | Ht 71.5 in | Wt 257.0 lb

## 2024-04-25 DIAGNOSIS — Z9189 Other specified personal risk factors, not elsewhere classified: Secondary | ICD-10-CM

## 2024-04-25 DIAGNOSIS — Z1331 Encounter for screening for depression: Secondary | ICD-10-CM | POA: Diagnosis not present

## 2024-04-25 DIAGNOSIS — Z79818 Long term (current) use of other agents affecting estrogen receptors and estrogen levels: Secondary | ICD-10-CM

## 2024-04-25 DIAGNOSIS — Z01419 Encounter for gynecological examination (general) (routine) without abnormal findings: Secondary | ICD-10-CM

## 2024-04-25 DIAGNOSIS — N951 Menopausal and female climacteric states: Secondary | ICD-10-CM | POA: Diagnosis not present

## 2024-04-25 MED ORDER — ESTRADIOL 1 MG PO TABS: 1.0000 mg | ORAL_TABLET | Freq: Every day | ORAL | 3 refills | Status: AC

## 2024-04-25 NOTE — Progress Notes (Signed)
 58 y.o. G55P0000 Married Caucasian female here for annual exam.     Doing well on Estrace . Wants to continue.  Now seeing a Systems analyst.   Hx of fractured pelvis (unknown reason) and fibula (fall).  States she had a normal bone density.      Not taking bone building medication.   Takes vit D daily.  2 new kittens.  PCP: Verena Mems, MD  Rheumatology:  Jon Learn, MD  Patient's last menstrual period was 02/10/2014 (exact date).           Sexually active: No.  The current method of family planning is status post hysterectomy.    Menopausal hormone therapy:  Estrace   Exercising: Yes  Personal trainer  Smoker:  no  OB History  Gravida Para Term Preterm AB Living  0 0 0 0 0 0  SAB IAB Ectopic Multiple Live Births  0 0 0 0 0     HEALTH MAINTENANCE: Last 2 paps:  06/22/16 neg HR HPV neg  History of abnormal Pap or positive HPV:  no Mammogram:   09/27/23 Breast Density Cat B, BIRADS Cat 1 neg  Colonoscopy:  09/01/17 - due in 2028 Bone Density:  [facility]?  Rheumatology following.   Immunization History  Administered Date(s) Administered   Influenza-Unspecified 07/31/2018   Tdap 10/17/2005, 06/22/2016   Zoster Recombinant(Shingrix) 08/06/2019      reports that she has never smoked. She has never used smokeless tobacco. She reports current alcohol use. She reports that she does not use drugs.  Past Medical History:  Diagnosis Date   Anxiety 1999   Depression 1999   Fibula fracture    right   Pelvic fracture (HCC)    right   RA (rheumatoid arthritis) (HCC) 2013   hands, elbows, knees   Seasonal allergies    Shingles     Past Surgical History:  Procedure Laterality Date   CYSTOSCOPY N/A 02/25/2014   Procedure: CYSTOSCOPY;  Surgeon: Bobie FORBES Crown de Charlynn FORBES Cary, MD;  Location: [facility];  Service: Gynecology;  Laterality: N/A;   ROBOTIC ASSISTED TOTAL HYSTERECTOMY Bilateral 02/25/2014   Procedure: ROBOTIC ASSISTED TOTAL HYSTERECTOMY WITH BILATERAL  SALPINGOOPHARECTOMY;  Surgeon: Bobie FORBES Crown de Charlynn FORBES Cary, MD;  Location: [facility];  Service: Gynecology;  Laterality: Bilateral;   WISDOM TOOTH EXTRACTION  2007    Current Outpatient Medications  Medication Sig Dispense Refill   busPIRone  (BUSPAR ) 15 MG tablet Take 15 mg by mouth.     cholecalciferol (VITAMIN D ) 1000 UNITS tablet Take 1,000 Units by mouth daily.     estradiol  (ESTRACE ) 1 MG tablet TAKE 1 TABLET BY MOUTH EVERY DAY 90 tablet 0   fluticasone  (FLONASE ) 50 MCG/ACT nasal spray Place 1 spray into both nostrils daily.     folic acid (FOLVITE) 1 MG tablet Take 1 mg by mouth.     hydroxychloroquine  (PLAQUENIL ) 200 MG tablet Take 400 mg by mouth daily.      HYRIMOZ 40 MG/0.4ML SOAJ Inject into the skin.     lamoTRIgine  (LAMICTAL ) 150 MG tablet Take 1 tablet by mouth daily.     LORazepam  (ATIVAN ) 0.5 MG tablet Take 0.5 mg by mouth as needed.     meloxicam (MOBIC) 15 MG tablet Take 15 mg by mouth daily.     methotrexate (RHEUMATREX) 2.5 MG tablet Take 25 mg by mouth once a week. Every Sunday.  Takes 10 of the 2.5 mg tablets to = 25 mg dose     Methotrexate (  XATMEP PO) methotrexate     Multiple Vitamin (MULTIVITAMIN) tablet Take 1 tablet by mouth daily.     Omega-3 Fatty Acids (FISH OIL) 1000 MG CAPS 1 capsule Orally Once a day     sertraline  (ZOLOFT ) 50 MG tablet Take 150 mg by mouth daily. 3 tabs daily     traMADol  (ULTRAM ) 50 MG tablet Take by mouth as needed. (Patient not taking: Reported on 04/25/2024)     vitamin E 45 MG (100 UNITS) capsule 1 capsule Orally Once a day (Patient not taking: Reported on 04/25/2024)     No current facility-administered medications for this visit.    ALLERGIES: Patient has no known allergies.  Family History  Problem Relation Age of Onset   Thyroid disease Mother        hypothyroid    Breast cancer Mother 7       lumpectomy, no other treatment   Heart failure Father    Dementia Father    Stroke Father    Hypertension Father     Psoriasis Father    Breast cancer Paternal Aunt 22   Lupus Maternal Grandmother    Breast cancer Maternal Grandmother 5   Liver cancer Maternal Grandmother 49    Review of Systems  All other systems reviewed and are negative.   PHYSICAL EXAM:  BP 116/78 (BP Location: Left Arm, Patient Position: Sitting)   Pulse 97   Ht 5' 11.5 (1.816 m)   Wt 257 lb (116.6 kg)   LMP 02/10/2014 (Exact Date)   SpO2 99%   BMI 35.34 kg/m     General appearance: alert, cooperative and appears stated age Head: normocephalic, without obvious abnormality, atraumatic Neck: no adenopathy, supple, symmetrical, trachea midline and thyroid normal to inspection and palpation Lungs: clear to auscultation bilaterally Breasts: normal appearance, no masses or tenderness, No nipple retraction or dimpling, No nipple discharge or bleeding, No axillary adenopathy Heart: regular rate and rhythm Abdomen: soft, non-tender; no masses, no organomegaly Extremities: extremities normal, atraumatic, no cyanosis or edema Skin: skin color, texture, turgor normal. No rashes or lesions Lymph nodes: cervical, supraclavicular, and axillary nodes normal. Neurologic: grossly normal  Pelvic: External genitalia:  no lesions              No abnormal inguinal nodes palpated.              Urethra:  normal appearing urethra with no masses, tenderness or lesions              Bartholins and Skenes: normal                 Vagina: normal appearing vagina with normal color and discharge, no lesions              Cervix: absent              Pap taken: no Bimanual Exam:  Uterus: absent              Adnexa: no mass, fullness, tenderness              Rectal exam: yes.  Confirms.              Anus:  normal sphincter tone, no lesions  Chaperone was present for exam:  Kari HERO, CMA  ASSESSMENT: Well woman visit with gynecologic exam. Status post robotic total laparoscopic hysterectomy with bilateral salpingo-oophorectomy, cystoscopy 2015.  On  ERT.  Vaginal atrophy.  FH breast cancer in mother and maternal grandmother. Increased risk  of breast cancer.  21.2%.  Negative genetic testing.  RA. Hx fractures. PHQ-2-9: 0  PLAN: Mammogram screening discussed. Will start MRI of breast. Self breast awareness reviewed. Pap and HRV collected:  no.  Not indicated. Guidelines for Calcium, Vitamin D , regular exercise program including cardiovascular and weight bearing exercise. Medication refills:  Estrace  1 mg daily.  #90, RF 3. I discussed increased risk of stroke, DVT, PE.   We discussed [facility] and that estrogen use does not increase breast cancer, yet she is at increased baseline risk.  I recommend a consultation with endocrinology regarding her fractures.  She will ask her rheumatologist for this referral.  Follow up:  yearly and prn.

## 2024-04-25 NOTE — Patient Instructions (Signed)

## 2024-04-26 ENCOUNTER — Encounter: Payer: Self-pay | Admitting: Obstetrics and Gynecology

## 2024-04-28 ENCOUNTER — Ambulatory Visit: Payer: Self-pay | Admitting: Obstetrics and Gynecology

## 2024-04-30 ENCOUNTER — Telehealth: Payer: Self-pay | Admitting: *Deleted

## 2024-04-30 DIAGNOSIS — Z9189 Other specified personal risk factors, not elsewhere classified: Secondary | ICD-10-CM

## 2024-04-30 NOTE — Telephone Encounter (Signed)
 Updated order for breast MRI signed.  Thank you.

## 2024-04-30 NOTE — Telephone Encounter (Signed)
 Jenny Bailey with [facility] left message requesting new order for Bilateral Breast MRI w/wo contrast.   Patient has current order for left breast MRI of breast only done bilateral.   Dr. Nikki -ok to update MRI order for Increased risk of BRCA 21.2%

## 2024-05-30 ENCOUNTER — Other Ambulatory Visit: Payer: Self-pay | Admitting: Rheumatology

## 2024-05-30 DIAGNOSIS — R7989 Other specified abnormal findings of blood chemistry: Secondary | ICD-10-CM

## 2024-06-04 ENCOUNTER — Ambulatory Visit
Admission: RE | Admit: 2024-06-04 | Discharge: 2024-06-04 | Disposition: A | Discharge status: Home / Self Care | Source: Ambulatory Visit | Attending: Rheumatology | Admitting: Rheumatology

## 2024-06-04 DIAGNOSIS — R7989 Other specified abnormal findings of blood chemistry: Secondary | ICD-10-CM

## 2024-07-24 ENCOUNTER — Telehealth: Admitting: Physician Assistant

## 2024-07-24 DIAGNOSIS — R6889 Other general symptoms and signs: Secondary | ICD-10-CM | POA: Diagnosis not present

## 2024-07-24 DIAGNOSIS — R110 Nausea: Secondary | ICD-10-CM | POA: Diagnosis not present

## 2024-07-24 DIAGNOSIS — R051 Acute cough: Secondary | ICD-10-CM | POA: Diagnosis not present

## 2024-07-24 MED ORDER — OSELTAMIVIR PHOSPHATE 75 MG PO CAPS: 75.0000 mg | ORAL_CAPSULE | Freq: Two times a day (BID) | ORAL | 0 refills | Status: AC

## 2024-07-24 MED ORDER — PSEUDOEPH-BROMPHEN-DM 30-2-10 MG/5ML PO SYRP: 5.0000 mL | ORAL_SOLUTION | Freq: Four times a day (QID) | ORAL | 0 refills | Status: AC | PRN

## 2024-07-24 MED ORDER — ONDANSETRON 4 MG PO TBDP: 4.0000 mg | ORAL_TABLET | Freq: Three times a day (TID) | ORAL | 0 refills | Status: AC | PRN

## 2024-07-24 NOTE — Progress Notes (Signed)
 Virtual Visit Consent   Jenny Bailey, you are scheduled for a virtual visit with a [facility] provider today. Just as with appointments in the office, your consent must be obtained to participate. Your consent will be active for this visit and any virtual visit you may have with one of our providers in the next 365 days. If you have a MyChart account, a copy of this consent can be sent to you electronically.  As this is a virtual visit, video technology does not allow for your provider to perform a traditional examination. This may limit your provider's ability to fully assess your condition. If your provider identifies any concerns that need to be evaluated in person or the need to arrange testing (such as labs, EKG, etc.), we will make arrangements to do so. Although advances in technology are sophisticated, we cannot ensure that it will always work on either your end or our end. If the connection with a video visit is poor, the visit may have to be switched to a telephone visit. With either a video or telephone visit, we are not always able to ensure that we have a secure connection.  By engaging in this virtual visit, you consent to the provision of healthcare and authorize for your insurance to be billed (if applicable) for the services provided during this visit. Depending on your insurance coverage, you may receive a charge related to this service.  I need to obtain your verbal consent now. Are you willing to proceed with your visit today? Jenny Bailey has provided verbal consent on 07/24/2024 for a virtual visit (video or telephone). Jenny CHRISTELLA Dickinson, PA-C  Date: 07/24/2024 10:12 AM   Virtual Visit via Video Note   I, Jenny Bailey, connected with  Jenny Bailey  ([id number], 1966/08/30) on 07/24/24 at 10:15 AM EDT by a video-enabled telemedicine application and verified that I am speaking with the correct person using two identifiers.  Location: Patient: Virtual Visit Location Patient:  Home Provider: Virtual Visit Location Provider: Home Office   I discussed the limitations of evaluation and management by telemedicine and the availability of in person appointments. The patient expressed understanding and agreed to proceed.    History of Present Illness: Jenny Bailey is a 58 y.o. who identifies as a female who was assigned female at birth, and is being seen today for flu-like symptoms.  HPI: URI  This is a new problem. The current episode started yesterday. The problem has been gradually worsening. Maximum temperature: 99. The fever has been present for Less than 1 day. Associated symptoms include congestion, coughing, headaches, nausea, rhinorrhea (and post nasal drainage), sinus pain, sneezing, a sore throat and vomiting. Pertinent negatives include no chest pain, diarrhea, ear pain, plugged ear sensation, swollen glands or wheezing. She has tried acetaminophen  (ricola) for the symptoms. The treatment provided no relief.   Had been on a trip where she was at a concert and a museum, and in crowded areas.  Did take an at home Covid test that was negative.  Problems:  Patient Active Problem List   Diagnosis Date Noted   At high risk for breast cancer 05/26/2023   Genetic testing 03/15/2023   Family history of breast cancer 02/20/2023   Pain in joint of right hip 11/28/2022   Closed fracture of lateral malleolus of right fibula 02/08/2022   Arthritis, rheumatoid (HCC) 06/16/2015   Post-menopause on HRT (hormone replacement therapy) 06/16/2015   Anxiety and depression 06/16/2015    Allergies: No Known  Allergies Medications:  Current Outpatient Medications:    brompheniramine-pseudoephedrine-DM 30-2-10 MG/5ML syrup, Take 5 mLs by mouth 4 (four) times daily as needed., Disp: 120 mL, Rfl: 0   ondansetron  (ZOFRAN -ODT) 4 MG disintegrating tablet, Take 1 tablet (4 mg total) by mouth every 8 (eight) hours as needed., Disp: 20 tablet, Rfl: 0   oseltamivir  (TAMIFLU ) 75 MG capsule,  Take 1 capsule (75 mg total) by mouth 2 (two) times daily., Disp: 10 capsule, Rfl: 0   busPIRone  (BUSPAR ) 15 MG tablet, Take 15 mg by mouth., Disp: , Rfl:    cholecalciferol (VITAMIN D ) 1000 UNITS tablet, Take 1,000 Units by mouth daily., Disp: , Rfl:    estradiol  (ESTRACE ) 1 MG tablet, Take 1 tablet (1 mg total) by mouth daily., Disp: 90 tablet, Rfl: 3   folic acid (FOLVITE) 1 MG tablet, Take 1 mg by mouth., Disp: , Rfl:    hydroxychloroquine  (PLAQUENIL ) 200 MG tablet, Take 400 mg by mouth daily. , Disp: , Rfl:    HYRIMOZ 40 MG/0.4ML SOAJ, Inject into the skin., Disp: , Rfl:    lamoTRIgine  (LAMICTAL ) 150 MG tablet, Take 1 tablet by mouth daily., Disp: , Rfl:    LORazepam  (ATIVAN ) 0.5 MG tablet, Take 0.5 mg by mouth as needed., Disp: , Rfl:    meloxicam (MOBIC) 15 MG tablet, Take 15 mg by mouth daily., Disp: , Rfl:    methotrexate (RHEUMATREX) 2.5 MG tablet, Take 25 mg by mouth once a week. Every Sunday.  Takes 10 of the 2.5 mg tablets to = 25 mg dose, Disp: , Rfl:    Methotrexate (XATMEP PO), methotrexate, Disp: , Rfl:    Multiple Vitamin (MULTIVITAMIN) tablet, Take 1 tablet by mouth daily., Disp: , Rfl:    Omega-3 Fatty Acids (FISH OIL) 1000 MG CAPS, 1 capsule Orally Once a day, Disp: , Rfl:    sertraline  (ZOLOFT ) 50 MG tablet, Take 150 mg by mouth daily. 3 tabs daily, Disp: , Rfl:    traMADol  (ULTRAM ) 50 MG tablet, Take by mouth as needed. (Patient not taking: Reported on 04/25/2024), Disp: , Rfl:    vitamin E 45 MG (100 UNITS) capsule, 1 capsule Orally Once a day (Patient not taking: Reported on 04/25/2024), Disp: , Rfl:   Observations/Objective: Patient is well-developed, well-nourished in no acute distress.  Resting comfortably at home.  Head is normocephalic, atraumatic.  No labored breathing.  Speech is clear and coherent with logical content.  Patient is alert and oriented at baseline.    Assessment and Plan: 1. Acute cough (Primary) - brompheniramine-pseudoephedrine-DM 30-2-10  MG/5ML syrup; Take 5 mLs by mouth 4 (four) times daily as needed.  Dispense: 120 mL; Refill: 0  2. Flu-like symptoms - oseltamivir  (TAMIFLU ) 75 MG capsule; Take 1 capsule (75 mg total) by mouth 2 (two) times daily.  Dispense: 10 capsule; Refill: 0  3. Nausea - ondansetron  (ZOFRAN -ODT) 4 MG disintegrating tablet; Take 1 tablet (4 mg total) by mouth every 8 (eight) hours as needed.  Dispense: 20 tablet; Refill: 0  - Suspected viral URI with negative covid testing - Possible flu with recent travel - Limited testing availability that would delay appropriate treatment waiting on results - Tamiflu  prescribed for possible flu - Zofran  added for nausea - Bromfed DM for cough and congestion - Push fluids - Symptomatic management OTC of choice as needed - Seek in person evaluation if symptoms worsen or fail to improve   Follow Up Instructions: I discussed the assessment and treatment plan with the patient. The  patient was provided an opportunity to ask questions and all were answered. The patient agreed with the plan and demonstrated an understanding of the instructions.  A copy of instructions were sent to the patient via MyChart unless otherwise noted below.    The patient was advised to call back or seek an in-person evaluation if the symptoms worsen or if the condition fails to improve as anticipated.    Jenny CHRISTELLA Dickinson, PA-C

## 2024-07-24 NOTE — Patient Instructions (Signed)
 Jenny Bailey, thank you for joining Delon CHRISTELLA Dickinson, PA-C for today's virtual visit.  While this provider is not your primary care provider (PCP), if your PCP is located in our provider database this encounter information will be shared with them immediately following your visit.   A [facility] MyChart account gives you access to today's visit and all your visits, tests, and labs performed at [facility]  click here if you don't have a [facility] MyChart account or go to [email or web address]  Consent: (Patient) Jenny Bailey provided verbal consent for this virtual visit at the beginning of the encounter.  Current Medications:  Current Outpatient Medications:    brompheniramine-pseudoephedrine-DM 30-2-10 MG/5ML syrup, Take 5 mLs by mouth 4 (four) times daily as needed., Disp: 120 mL, Rfl: 0   ondansetron  (ZOFRAN -ODT) 4 MG disintegrating tablet, Take 1 tablet (4 mg total) by mouth every 8 (eight) hours as needed., Disp: 20 tablet, Rfl: 0   oseltamivir  (TAMIFLU ) 75 MG capsule, Take 1 capsule (75 mg total) by mouth 2 (two) times daily., Disp: 10 capsule, Rfl: 0   busPIRone  (BUSPAR ) 15 MG tablet, Take 15 mg by mouth., Disp: , Rfl:    cholecalciferol (VITAMIN D ) 1000 UNITS tablet, Take 1,000 Units by mouth daily., Disp: , Rfl:    estradiol  (ESTRACE ) 1 MG tablet, Take 1 tablet (1 mg total) by mouth daily., Disp: 90 tablet, Rfl: 3   folic acid (FOLVITE) 1 MG tablet, Take 1 mg by mouth., Disp: , Rfl:    hydroxychloroquine  (PLAQUENIL ) 200 MG tablet, Take 400 mg by mouth daily. , Disp: , Rfl:    HYRIMOZ 40 MG/0.4ML SOAJ, Inject into the skin., Disp: , Rfl:    lamoTRIgine  (LAMICTAL ) 150 MG tablet, Take 1 tablet by mouth daily., Disp: , Rfl:    LORazepam  (ATIVAN ) 0.5 MG tablet, Take 0.5 mg by mouth as needed., Disp: , Rfl:    meloxicam (MOBIC) 15 MG tablet, Take 15 mg by mouth daily., Disp: , Rfl:    methotrexate (RHEUMATREX) 2.5 MG tablet, Take 25 mg by mouth once a week. Every  Sunday.  Takes 10 of the 2.5 mg tablets to = 25 mg dose, Disp: , Rfl:    Methotrexate (XATMEP PO), methotrexate, Disp: , Rfl:    Multiple Vitamin (MULTIVITAMIN) tablet, Take 1 tablet by mouth daily., Disp: , Rfl:    Omega-3 Fatty Acids (FISH OIL) 1000 MG CAPS, 1 capsule Orally Once a day, Disp: , Rfl:    sertraline  (ZOLOFT ) 50 MG tablet, Take 150 mg by mouth daily. 3 tabs daily, Disp: , Rfl:    traMADol  (ULTRAM ) 50 MG tablet, Take by mouth as needed. (Patient not taking: Reported on 04/25/2024), Disp: , Rfl:    vitamin E 45 MG (100 UNITS) capsule, 1 capsule Orally Once a day (Patient not taking: Reported on 04/25/2024), Disp: , Rfl:    Medications ordered in this encounter:  Meds ordered this encounter  Medications   oseltamivir  (TAMIFLU ) 75 MG capsule    Sig: Take 1 capsule (75 mg total) by mouth 2 (two) times daily.    Dispense:  10 capsule    Refill:  0    Supervising Provider:   LAMPTEY, PHILIP O [[id number]]   ondansetron  (ZOFRAN -ODT) 4 MG disintegrating tablet    Sig: Take 1 tablet (4 mg total) by mouth every 8 (eight) hours as needed.    Dispense:  20 tablet    Refill:  0    Supervising Provider:   BLAISE ALEENE KIDD [phone]  brompheniramine-pseudoephedrine-DM 30-2-10 MG/5ML syrup    Sig: Take 5 mLs by mouth 4 (four) times daily as needed.    Dispense:  120 mL    Refill:  0    Supervising Provider:   BLAISE ALEENE KIDD [[id number]]     *If you need refills on other medications prior to your next appointment, please contact your pharmacy*  Follow-Up: Call back or seek an in-person evaluation if the symptoms worsen or if the condition fails to improve as anticipated.  [facility] [phone]  Other Instructions Influenza, Adult Influenza is also called the flu. It's an infection that affects your respiratory tract. This includes your nose, throat, windpipe, and lungs. The flu is contagious. This means it spreads easily from person to person. It causes symptoms that  are like a cold. It can also cause a high fever and body aches. What are the causes? The flu is caused by the influenza virus. You can get it by: Breathing in droplets that are in the air after an infected person coughs or sneezes. Touching something that has the virus on it and then touching your mouth, nose, or eyes. What increases the risk? You may be more likely to get the flu if: You don't wash your hands often. You're near a lot of people during cold and flu season. You touch your mouth, eyes, or nose without washing your hands first. You don't get a flu shot each year. You may also be more at risk for the flu and serious problems, such as a lung infection called pneumonia, if: You're older than 65. You're pregnant. Your immune system is weak. Your immune system is your body's defense system. You have a long-term, or chronic, condition, such as: Heart, kidney, or lung disease. Diabetes. A liver disorder. Asthma. You're very overweight. You have anemia. This is when you don't have enough red blood cells in your body. What are the signs or symptoms? Flu symptoms often start all of a sudden. They may last 4-14 days and include: Fever and chills. Headaches, body aches, or muscle aches. Sore throat. Cough. Runny or stuffy nose. Discomfort in your chest. Not wanting to eat as much as normal. Feeling weak or tired. Feeling dizzy. Nausea or vomiting. How is this diagnosed? The flu may be diagnosed based on your symptoms and medical history. You may also have a physical exam. A swab may be taken from your nose or throat and tested for the virus. How is this treated? If the flu is found early, you can be treated with antiviral medicine. This may be given to you by mouth or through an IV. It can help you feel less sick and get better faster. Taking care of yourself at home can also help your symptoms get better. Your health care provider may tell you to: Take over-the-counter  medicines. Drink lots of fluids. The flu often goes away on its own. If you have very bad symptoms or problems caused by the flu, you may need to be treated in a hospital. Follow these instructions at home: Activity Rest as needed. Get lots of sleep. Stay home from work or school as told by your provider. Leave home only to go see your provider. Do not leave home for other reasons until you don't have a fever for 24 hours without taking medicine. Eating and drinking Take an oral rehydration solution (ORS). This is a drink that is sold at pharmacies and stores. Drink enough fluid to keep your pee  pale yellow. Try to drink small amounts of clear fluids. These include water, ice chips, fruit juice mixed with water, and low-calorie sports drinks. Try to eat bland foods that are easy to digest. These include bananas, applesauce, rice, lean meats, toast, and crackers. Avoid drinks that have a lot of sugar or caffeine in them. These include energy drinks, regular sports drinks, and soda. Do not drink alcohol. Do not eat spicy or fatty foods. General instructions     Take your medicines only as told by your provider. Use a cool mist humidifier to add moisture to the air in your home. This can make it easier for you to breathe. You should also clean the humidifier every day. To do so: Empty the water. Pour clean water in. Cover your mouth and nose when you cough or sneeze. Wash your hands with soap and water often and for at least 20 seconds. It's extra important to do so after you cough or sneeze. If you can't use soap and water, use hand sanitizer. How is this prevented?  Get a flu shot every year. Ask your provider when you should get your flu shot. Stay away from people who are sick during fall and winter. Fall and winter are cold and flu season. Contact a health care provider if: You get new symptoms. You have chest pain. You have watery poop, also called diarrhea. You have a  fever. Your cough gets worse. You start to have more mucus. You feel like you may vomit, or you vomit. Get help right away if: You become short of breath or have trouble breathing. Your skin or nails turn blue. You have very bad pain or stiffness in your neck. You get a sudden headache or pain in your face or ear. You vomit each time you eat or drink. These symptoms may be an emergency. Call 911 right away. Do not wait to see if the symptoms will go away. Do not drive yourself to the hospital. This information is not intended to replace advice given to you by your health care provider. Make sure you discuss any questions you have with your health care provider. Document Revised: 07/06/2023 Document Reviewed: 11/10/2022 Elsevier Patient Education  2024 [employer]   If you have been instructed to have an in-person evaluation today at a local Urgent Care facility, please use the link below. It will take you to a list of all of our available [facility], including address, phone number and hours of operation. Please do not delay care.  [facility]  If you or a family member do not have a primary care provider, use the link below to schedule a visit and establish care. When you choose a [facility] primary care physician or advanced practice provider, you gain a long-term partner in health. Find a Primary Care Provider  Learn more about [facility] in-office and virtual care options: [facility] - Get Care Now

## 2024-10-02 LAB — HM MAMMOGRAPHY

## 2024-10-04 ENCOUNTER — Ambulatory Visit: Payer: Self-pay | Admitting: Obstetrics and Gynecology

## 2025-04-28 ENCOUNTER — Ambulatory Visit: Admitting: Obstetrics and Gynecology
